# Patient Record
Sex: Male | Born: 1950 | Race: White | Hispanic: No | Marital: Married | Smoking: Former smoker
Health system: Southern US, Community
[De-identification: ages and names within clinical notes are randomized; demographics above are authoritative.]

## PROBLEM LIST (undated history)

## (undated) DIAGNOSIS — M722 Plantar fascial fibromatosis: Secondary | ICD-10-CM

## (undated) DIAGNOSIS — N529 Male erectile dysfunction, unspecified: Secondary | ICD-10-CM

## (undated) DIAGNOSIS — I10 Essential (primary) hypertension: Secondary | ICD-10-CM

## (undated) DIAGNOSIS — E785 Hyperlipidemia, unspecified: Secondary | ICD-10-CM

## (undated) DIAGNOSIS — K51411 Inflammatory polyps of colon with rectal bleeding: Secondary | ICD-10-CM

## (undated) DIAGNOSIS — K219 Gastro-esophageal reflux disease without esophagitis: Secondary | ICD-10-CM

## (undated) HISTORY — DX: Inflammatory polyps of colon with rectal bleeding: K51.411

## (undated) HISTORY — DX: Male erectile dysfunction, unspecified: N52.9

## (undated) HISTORY — PX: KNEE ARTHROSCOPY: SUR90

## (undated) HISTORY — DX: Gastro-esophageal reflux disease without esophagitis: K21.9

## (undated) HISTORY — DX: Essential (primary) hypertension: I10

## (undated) HISTORY — DX: Plantar fascial fibromatosis: M72.2

## (undated) HISTORY — DX: Hyperlipidemia, unspecified: E78.5

---

## 1997-11-29 HISTORY — PX: CHOLECYSTECTOMY: SHX55

## 2003-06-27 ENCOUNTER — Encounter: Payer: Self-pay | Admitting: Family Medicine

## 2003-07-15 ENCOUNTER — Encounter: Payer: Self-pay | Admitting: Family Medicine

## 2005-02-15 ENCOUNTER — Encounter: Admission: RE | Admit: 2005-02-15 | Discharge: 2005-02-15 | Payer: Self-pay | Admitting: Occupational Medicine

## 2005-05-20 ENCOUNTER — Ambulatory Visit (HOSPITAL_BASED_OUTPATIENT_CLINIC_OR_DEPARTMENT_OTHER): Admission: RE | Admit: 2005-05-20 | Discharge: 2005-05-20 | Payer: Self-pay | Admitting: Orthopedic Surgery

## 2005-05-20 ENCOUNTER — Ambulatory Visit (HOSPITAL_COMMUNITY): Admission: RE | Admit: 2005-05-20 | Discharge: 2005-05-20 | Payer: Self-pay | Admitting: Orthopedic Surgery

## 2005-10-16 ENCOUNTER — Ambulatory Visit (HOSPITAL_COMMUNITY): Admission: RE | Admit: 2005-10-16 | Discharge: 2005-10-16 | Payer: Self-pay | Admitting: Sports Medicine

## 2006-05-27 ENCOUNTER — Ambulatory Visit: Payer: Self-pay | Admitting: Family Medicine

## 2006-05-27 LAB — CONVERTED CEMR LAB
Blood Glucose, Fasting: 104 mg/dL
PSA: 1.17 ng/mL
TSH: 1.08 microintl units/mL

## 2006-06-29 ENCOUNTER — Encounter: Payer: Self-pay | Admitting: Family Medicine

## 2007-06-23 ENCOUNTER — Telehealth: Payer: Self-pay | Admitting: Family Medicine

## 2008-02-19 ENCOUNTER — Ambulatory Visit: Payer: Self-pay | Admitting: Family Medicine

## 2008-02-19 LAB — CONVERTED CEMR LAB
ALT: 30 units/L (ref 0–53)
AST: 22 units/L (ref 0–37)
Albumin: 3.7 g/dL (ref 3.5–5.2)
Alkaline Phosphatase: 79 units/L (ref 39–117)
BUN: 17 mg/dL (ref 6–23)
Bilirubin, Direct: 0.1 mg/dL (ref 0.0–0.3)
CO2: 30 meq/L (ref 19–32)
Calcium: 8.8 mg/dL (ref 8.4–10.5)
Chloride: 108 meq/L (ref 96–112)
Cholesterol: 159 mg/dL (ref 0–200)
Creatinine, Ser: 0.9 mg/dL (ref 0.4–1.5)
GFR calc Af Amer: 113 mL/min
GFR calc non Af Amer: 93 mL/min
Glucose, Bld: 96 mg/dL (ref 70–99)
HDL: 32.2 mg/dL — ABNORMAL LOW (ref >39.0)
LDL Cholesterol: 99 mg/dL (ref 0–99)
Potassium: 3.6 meq/L (ref 3.5–5.1)
Sodium: 144 meq/L (ref 135–145)
TSH: 1.24 microintl units/mL (ref 0.35–5.50)
Total Bilirubin: 0.9 mg/dL (ref 0.3–1.2)
Total CHOL/HDL Ratio: 4.9
Total Protein: 6.3 g/dL (ref 6.0–8.3)
Triglycerides: 137 mg/dL (ref 0–149)
VLDL: 27 mg/dL (ref 0–40)

## 2008-02-22 ENCOUNTER — Encounter: Payer: Self-pay | Admitting: Family Medicine

## 2008-02-22 DIAGNOSIS — E7800 Pure hypercholesterolemia, unspecified: Secondary | ICD-10-CM

## 2008-02-22 DIAGNOSIS — J309 Allergic rhinitis, unspecified: Secondary | ICD-10-CM | POA: Insufficient documentation

## 2008-02-22 DIAGNOSIS — Z125 Encounter for screening for malignant neoplasm of prostate: Secondary | ICD-10-CM | POA: Insufficient documentation

## 2008-02-26 ENCOUNTER — Ambulatory Visit: Payer: Self-pay | Admitting: Family Medicine

## 2008-03-20 ENCOUNTER — Emergency Department (HOSPITAL_COMMUNITY): Admission: EM | Admit: 2008-03-20 | Discharge: 2008-03-20 | Payer: Self-pay | Admitting: Emergency Medicine

## 2008-03-28 ENCOUNTER — Encounter (INDEPENDENT_AMBULATORY_CARE_PROVIDER_SITE_OTHER): Payer: Self-pay | Admitting: *Deleted

## 2008-03-28 ENCOUNTER — Ambulatory Visit: Payer: Self-pay | Admitting: Family Medicine

## 2008-03-28 LAB — CONVERTED CEMR LAB
OCCULT 1: NEGATIVE
OCCULT 2: NEGATIVE
OCCULT 3: NEGATIVE

## 2008-09-03 ENCOUNTER — Telehealth: Payer: Self-pay | Admitting: Family Medicine

## 2008-12-02 ENCOUNTER — Ambulatory Visit: Payer: Self-pay | Admitting: Family Medicine

## 2009-02-25 ENCOUNTER — Telehealth: Payer: Self-pay | Admitting: Family Medicine

## 2009-02-26 ENCOUNTER — Encounter: Payer: Self-pay | Admitting: Family Medicine

## 2009-03-06 ENCOUNTER — Ambulatory Visit: Payer: Self-pay | Admitting: Family Medicine

## 2009-03-06 DIAGNOSIS — D126 Benign neoplasm of colon, unspecified: Secondary | ICD-10-CM

## 2009-04-03 ENCOUNTER — Ambulatory Visit: Payer: Self-pay | Admitting: Gastroenterology

## 2009-04-14 ENCOUNTER — Ambulatory Visit: Payer: Self-pay | Admitting: Gastroenterology

## 2009-04-14 ENCOUNTER — Encounter: Payer: Self-pay | Admitting: Gastroenterology

## 2009-04-14 LAB — HM COLONOSCOPY

## 2009-04-15 ENCOUNTER — Encounter: Payer: Self-pay | Admitting: Gastroenterology

## 2009-10-27 ENCOUNTER — Telehealth: Payer: Self-pay | Admitting: Family Medicine

## 2010-03-19 ENCOUNTER — Telehealth: Payer: Self-pay | Admitting: Family Medicine

## 2010-03-30 ENCOUNTER — Encounter: Payer: Self-pay | Admitting: Family Medicine

## 2010-04-06 ENCOUNTER — Ambulatory Visit: Payer: Self-pay | Admitting: Family Medicine

## 2010-04-06 DIAGNOSIS — R739 Hyperglycemia, unspecified: Secondary | ICD-10-CM

## 2010-04-06 DIAGNOSIS — R5383 Other fatigue: Secondary | ICD-10-CM

## 2010-04-06 DIAGNOSIS — R5381 Other malaise: Secondary | ICD-10-CM

## 2010-05-11 ENCOUNTER — Ambulatory Visit: Payer: Self-pay | Admitting: Family Medicine

## 2010-05-11 DIAGNOSIS — R030 Elevated blood-pressure reading, without diagnosis of hypertension: Secondary | ICD-10-CM

## 2010-05-11 DIAGNOSIS — R42 Dizziness and giddiness: Secondary | ICD-10-CM

## 2010-07-07 ENCOUNTER — Encounter (INDEPENDENT_AMBULATORY_CARE_PROVIDER_SITE_OTHER): Payer: Self-pay | Admitting: *Deleted

## 2010-12-29 NOTE — Assessment & Plan Note (Signed)
Summary: CPX/DLO   Vital Signs:  Patient profile:   60 year old male Height:      73 inches Weight:      234.50 pounds BMI:     31.05 Temp:     97.9 degrees F oral Pulse rate:   80 / minute Pulse rhythm:   regular BP sitting:   118 / 70  (left arm) Cuff size:   large  Vitals Entered By: Sydell Axon LPN (Apr 06, 1609 1:53 PM) CC: 30 Minute checkup, had a colonoscopy 05/10 by Dr. Christella Hartigan   History of Present Illness: Pt here for Comp Exam. He has the typical aches and pains of wearing down, slowing down and tired.   Preventive Screening-Counseling & Management  Alcohol-Tobacco     Alcohol drinks/day: 0     Alcohol type:  less than once a month     Smoking Status: quit     Year Quit: 2004     Pack years: 25     Passive Smoke Exposure: no  Caffeine-Diet-Exercise     Caffeine use/day: 0     Does Patient Exercise: no     Times/week: <3  Problems Prior to Update: 1)  Colonic Polyps  (ICD-211.3) 2)  Special Screening Malig Neoplasms Other Sites  (ICD-V76.49) 3)  Health Maintenance Exam  (ICD-V70.0) 4)  Allergic Rhinitis  (ICD-477.9) 5)  Hyperplasia Prostate Uns w/o Ur Obst & Oth Luts  (ICD-600.90) 6)  Pure Hypercholesterolemia  (ICD-272.0)  Medications Prior to Update: 1)  Fexofenadine Hcl 180 Mg  Tabs (Fexofenadine Hcl) .Marland Kitchen.. 1 By Mouth Daily 2)  Nasacort Aq 55 Mcg/act  Aers (Triamcinolone Acetonide(Nasal)) .Marland Kitchen.. 1 Squirt Each Nostril Qd 3)  Glucosamine-Chondroitin 500-400 Mg  Caps (Glucosamine-Chondroitin) .Marland Kitchen.. 1 Daily By Mouth 4)  Ibutab 200 Mg  Tabs (Ibuprofen) .... 2 Tablets As Needed  By Mouth 5)  Fish Oil Concentrate 1000 Mg Caps (Omega-3 Fatty Acids) .... 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 (DR JACOBS) 04/14/2009  Family History: Last updated: 04/06/2010 Father:dec HOMICIDE VICTIM  +ETOH- Mother: dec  70 YOA : Emrg Surgery, bleeding internally, unsuccessful.+ELEVATED BP; +STROKES MULTIPLE BROTHER A 55 BROTHER A 49 SISTER A 61 CV: NEGATIVE HBP: + MOTHER DM: NEGATIVE GOUT/ARTHRITIS: PROSTATE CANCER: NEGATIVE BREAST/OVARIAN CANCER: NEGATIVE UTERINE CANCER: + MGM COLON CANCER: NEGATIVE DEPRESSION: NEGATIVE ETOH/DRUG ABUSE: +ETOH FATHER OTHER: + STROKES MOTHER MULTIPLE  Social History: Last updated: 02/22/2008 Marital Status: Married  LIVES WITH WIFE Children: 2 CHILDREN 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  Risk Factors: Alcohol Use: 0 (04/06/2010) Caffeine Use: 0 (04/06/2010) Exercise: no (04/06/2010)  Risk Factors: Smoking Status: quit (04/06/2010) Passive Smoke Exposure: no (04/06/2010)  Family History: Father:dec HOMICIDE VICTIM  +ETOH- Mother: dec  75 YOA : Emrg Surgery, bleeding internally, unsuccessful.+ELEVATED BP; +STROKES MULTIPLE BROTHER A 55 BROTHER A 49 SISTER A 61 CV: NEGATIVE HBP: + MOTHER DM: NEGATIVE GOUT/ARTHRITIS: PROSTATE CANCER: NEGATIVE BREAST/OVARIAN CANCER: NEGATIVE UTERINE CANCER: + MGM COLON CANCER: NEGATIVE DEPRESSION: NEGATIVE ETOH/DRUG ABUSE: +ETOH FATHER OTHER: + STROKES MOTHER MULTIPLE  Social History: Does Patient Exercise:  no  Review of Systems General:  Complains of fatigue; denies chills, fever, sweats, weakness, and weight  loss; see HPI. Eyes:  Denies blurring, discharge, and eye pain. ENT:  Denies decreased hearing, earache, and ringing in ears. CV:  Complains of fatigue; denies chest pain or discomfort, fainting, palpitations, shortness of breath with exertion, swelling of feet, and swelling of hands. Resp:  Denies cough, shortness of breath, and wheezing. GI:   Denies abdominal pain, bloody stools, change in bowel habits, constipation, dark tarry stools, diarrhea, indigestion, loss of appetite, nausea, vomiting, vomiting blood, and yellowish skin color. GU:  Denies discharge, dysuria, nocturia, and urinary frequency. MS:  Complains of low back pain; denies joint pain, muscle aches, cramps, and stiffness. Derm:  Denies dryness, itching, and rash. Neuro:  Denies numbness, poor balance, tingling, and tremors.  Physical Exam  General:  Well-developed,well-nourished,in no acute distress; alert,appropriate and cooperative throughout examination, slightly more  overweight. Head:  Normocephalic and atraumatic without obvious abnormalities. No apparent alopecia or balding. Sinuses nontender. Eyes:  Conjunctiva clear bilaterally.  Ears:  External ear exam shows no significant lesions or deformities.  Otoscopic examination reveals clear canals, tympanic membranes are intact bilaterally without bulging, retraction, inflammation or discharge. Hearing is grossly normal bilaterally. Nose:  External nasal examination shows no deformity or inflammation. Nasal mucosa are pink and moist without lesions or exudates. Mouth:  Oral mucosa and oropharynx without lesions or exudates.  Teeth in good repair. Neck:  No deformities, masses, or tenderness noted. Chest Wall:  No deformities, masses, tenderness or gynecomastia noted. Breasts:  No masses or gynecomastia noted Lungs:  Normal respiratory effort, chest expands symmetrically. Lungs are clear to auscultation, no crackles or wheezes. Heart:  Normal rate and regular rhythm. S1 and S2 normal without gallop, murmur, click, rub or other extra sounds. Abdomen:  Bowel sounds positive,abdomen soft and non-tender without masses, organomegaly or hernias noted. Rectal:  No external abnormalities noted. Normal sphincter tone. No rectal masses or tenderness. G neg. Genitalia:  Testes bilaterally descended without nodularity,  tenderness or masses. No scrotal masses or lesions. No penis lesions or urethral discharge. Prostate:  Prostate gland firm and smooth, no enlargement, nodularity, tenderness. 20gms. Msk:  No deformity or scoliosis noted of thoracic or lumbar spine.   Pulses:  R and L carotid,radial,femoral,dorsalis pedis and posterior tibial pulses are full and equal bilaterally Extremities:  No clubbing, cyanosis, edema, or deformity noted with normal full range of motion of all joints.   Neurologic:  No cranial nerve deficits noted. Station and gait are normal. Sensory, motor and coordinative functions appear intact. Skin:  Intact without suspicious lesions or rashes Cervical Nodes:  No lymphadenopathy noted Inguinal Nodes:  No significant adenopathy Psych:  Cognition and judgment appear intact. Alert and cooperative with normal attention span and concentration. No apparent delusions, illusions, hallucinations   Impression & Recommendations:  Problem # 1:  HEALTH MAINTENANCE EXAM (ICD-V70.0)  Reviewed preventive care protocols, scheduled due services, and updated immunizations. T dap updated, given at [facility].  Problem # 2:  COLONIC POLYPS (ICD-211.3) Assessment: Unchanged Found polyp again. Followup per GI.  Problem # 3:  ALLERGIC RHINITIS (ICD-477.9) Assessment: Unchanged  Stable. His updated medication list for this problem includes:    Fexofenadine Hcl 180 Mg Tabs (Fexofenadine hcl) .Marland Kitchen... 1 by mouth daily    Nasacort Aq 55 Mcg/act Aers (Triamcinolone acetonide(nasal)) .Marland Kitchen... 1 squirt each nostril daily  Problem # 4:  HYPERPLASIA PROSTATE UNS W/O UR OBST & OTH LUTS (ICD-600.90) Assessment: Unchanged Stable sxs. PSA and exam nml.  Problem # 5:  PURE HYPERCHOLESTEROLEMIA (ICD-272.0) Assessment: Unchanged Stable avoid  sweets and carbs. Labs Reviewed: SGOT: 22 (02/19/2008)   SGPT: 30 (02/19/2008)   HDL:32.2 (02/19/2008)  LDL:99 (02/19/2008)  Chol:159 (02/19/2008)  Trig:137 (02/19/2008)  Problem #  6:  FASTING HYPERGLYCEMIA (ICD-790.29) Assessment: New  Discussed avoiding sweets and carbs.  Labs Reviewed: Creat: 0.9 (02/19/2008)     Problem # 7:  FATIGUE (ICD-780.79) Assessment: New CBC and TSH ok. Will check Vit D. Pt vocalizes mild job dissatisfaction and mild depression therefrom which comes and goes. If Vit D nml, activity will help some. Come in if sxs worsen. Pt agreed.  Complete Medication List: 1)  Fexofenadine Hcl 180 Mg Tabs (Fexofenadine hcl) .Marland Kitchen.. 1 by mouth daily 2)  Nasacort Aq 55 Mcg/act Aers (Triamcinolone acetonide(nasal)) .Marland Kitchen.. 1 squirt each nostril daily 3)  Glucosamine-chondroitin 500-400 Mg Caps (Glucosamine-chondroitin) .Marland Kitchen.. 1 daily by mouth 4)  Ibutab 200 Mg Tabs (Ibuprofen) .... 2 tablets as needed  by mouth 5)  Fish Oil Concentrate 1000 Mg Caps (Omega-3 fatty acids) .... Twice a day by mouth 6)  Multivitamins Tabs (Multiple vitamin) .... Take one by mouth daily  Patient Instructions: 1)  Will check Vit D.lvl due to being tired.  2)  Call phone tree [phone], then [phone].  Current Allergies (reviewed today): No known allergies

## 2010-12-29 NOTE — Assessment & Plan Note (Signed)
Summary: EPISODE OF VERTIGO AND B/P SPIKED UP   Vital Signs:  Patient profile:   60 year old male Weight:      238 pounds Temp:     98.4 degrees F oral Pulse rate:   76 / minute Pulse rhythm:   regular BP sitting:   124 / 80  (left arm) Cuff size:   large  Vitals Entered By: Sydell Axon LPN (May 11, 2010 3:33 PM) CC: Had a bad episode of vertigo and BP has been elevated   History of Present Illness: Pt here for followup of vertigo, seen at [facility] one week ago. He woke up one day with eyes jerking thinking he was having a real problem and had bad spinning and resultant N/V. He figured out he had vertigo and pressure was 168/95. We don't know whether he had elevated BP or vertigo first. He was given  meclizine and phenergan, both,  which kept him subdued for the next few days!!!  His BP has been in the 160-170 range until Noon today and then was back down to 140/80s. He feels reasonably well otherwise.   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 1 By Mouth Daily 2)  Nasacort Aq 55 Mcg/act  Aers (Triamcinolone Acetonide(Nasal)) .Marland Kitchen.. 1 Squirt Each Nostril Daily 3)  Glucosamine-Chondroitin 500-400 Mg  Caps (Glucosamine-Chondroitin) .Marland Kitchen.. 1 Daily By Mouth 4)  Ibutab 200 Mg  Tabs (Ibuprofen) .... 2 Tablets As Needed  By Mouth 5)  Fish Oil Concentrate 1000 Mg Caps (Omega-3 Fatty Acids) .... Twice A Day By Mouth 6)  Multivitamins  Tabs (Multiple Vitamin) .... Take One By Mouth Daily  Allergies: No Known Drug Allergies  Physical Exam  General:   Well-developed,well-nourished,in no acute distress; alert,appropriate and cooperative throughout examination. Head:  Normocephalic and atraumatic without obvious abnormalities. No apparent alopecia or balding. Sinuses nontender. Eyes:  Conjunctiva clear bilaterally. No Nystagmus seen or inducible. Ears:  External ear exam shows no significant lesions or deformities.  Otoscopic examination reveals clear canals, tympanic membranes are intact bilaterally without bulging, retraction, inflammation or discharge. Hearing is grossly normal bilaterally. Nose:  External nasal examination shows no deformity or inflammation. Nasal mucosa are pink and moist without lesions or exudates. Mouth:  Oral mucosa and oropharynx without lesions or exudates.  Teeth in good repair. Neck:  No deformities, masses, or tenderness noted. Chest Wall:  No deformities, masses, tenderness or gynecomastia noted. Lungs:  Normal respiratory effort, chest expands symmetrically. Lungs are clear to auscultation, no crackles or wheezes. Heart:  Normal rate and regular rhythm. S1 and S2 normal without gallop, murmur, click, rub or other extra sounds. Neurologic:  No cranial nerve deficits noted. Station and gait are normal. Sensory, motor and coordinative functions appear intact.   Impression & Recommendations:  Problem # 1:  VERTIGO (ICD-780.4) Assessment New Discussed pathophysiology. Has as needed meds. Hopefully won't need any more. His updated medication list for this problem includes:    Fexofenadine Hcl 180 Mg Tabs (Fexofenadine hcl) .Marland Kitchen... 1 by mouth daily  Problem # 2:  ELEVATED BLOOD PRESSURE WITHOUT  DIAGNOSIS OF HYPERTENSION (ICD-796.2) Assessment: New Monitor BP for a while and assess trend. Come in if trend elevated. BP today: 124/80 Prior BP: 118/70 (04/06/2010)  Labs Reviewed: Creat: 0.9 (02/19/2008) Chol: 159 (02/19/2008)   HDL: 32.2 (02/19/2008)   LDL: 99 (02/19/2008)   TG: 137 (02/19/2008)  Instructed in low  sodium diet (DASH Handout) and behavior modification.    Complete Medication List: 1)  Fexofenadine Hcl 180 Mg Tabs (Fexofenadine hcl) .Marland Kitchen.. 1 by mouth daily 2)  Nasacort Aq 55 Mcg/act Aers (Triamcinolone acetonide(nasal)) .Marland Kitchen.. 1 squirt each nostril daily 3)  Glucosamine-chondroitin 500-400 Mg Caps (Glucosamine-chondroitin) .Marland Kitchen.. 1 daily by mouth 4)  Ibutab 200 Mg Tabs (Ibuprofen) .... 2 tablets as needed  by mouth 5)  Fish Oil Concentrate 1000 Mg Caps (Omega-3 fatty acids) .... Twice a day by mouth 6)  Multivitamins Tabs (Multiple vitamin) .... Take one by mouth daily  Patient Instructions: 1)  spent with pt.  Current Allergies (reviewed today): No known allergies

## 2010-12-29 NOTE — Progress Notes (Signed)
Summary: needs order for [facility]  Phone Note Call from Patient   Caller: Patient Call For: Shaune Leeks MD Summary of Call: Pt is coming for lab work next month and needs an order mailed to him that he can take to [facility]. Initial call taken by: Lowella Petties CMA,  March 19, 2010 3:16 PM  Follow-up for Phone Call        done. Follow-up by: Shaune Leeks MD,  March 19, 2010 4:52 PM  Additional Follow-up for Phone Call Additional follow up Details #1::        Order mailed. Additional Follow-up by: Lowella Petties CMA,  March 19, 2010 5:18 PM

## 2010-12-29 NOTE — Letter (Signed)
Summary: Nadara Eaton letter  [facility] at [facility]  [address] [place], [place] [address]   Phone: [phone]  Fax: [phone]       07/07/2010 MRN: [id number]  LOCHLIN EPPINGER [address] [place], [place]  [address]  Dear Mr. [facility], and [facility] announce the retirement of Arta Silence, M.D., from full-time practice at the [facility] office effective May 28, 2010 and his plans of returning part-time.  It is important to Dr. Hetty Ely and to our practice that you understand that [facility] has seven physicians in our office for your health care needs.  We will continue to offer the same exceptional care that you have today.    Dr. Hetty Ely has spoken to many of you about his plans for retirement and returning part-time in the fall.   We will continue to work with you through the transition to schedule appointments for you in the office and meet the high standards that [facility] is committed to.   Again, it is with great pleasure that we share the news that Dr. Hetty Ely will return to [facility] at [facility] in October of 2011 with a reduced schedule.    If you have any questions, or would like to request an appointment with one of our physicians, please call us at [phone] and press the option for Scheduling an appointment.  We take pleasure in providing you with excellent patient care and look forward to seeing you at your next office visit.  Our [facility] Physicians are:  Tillman Abide, M.D. Laurita Quint, M.D. Roxy Manns, M.D. Kerby Nora, M.D. Hannah Beat, M.D. Ruthe Mannan, M.D. We proudly welcomed Raechel Ache, M.D. and Eustaquio Boyden, M.D. to the practice in July/August 2011.  Sincerely,  [facility]

## 2011-04-16 NOTE — Op Note (Signed)
[name], [name]               ACCOUNT NO.:  [id number]   MEDICAL RECORD NO.:  [id number]          PATIENT TYPE:  AMB   LOCATION:  [facility]                          FACILITY:  [facility]   PHYSICIAN:  Rodney A. Mortenson, M.D.DATE OF BIRTH:  08-05-1951   DATE OF PROCEDURE:  05/20/2005  DATE OF DISCHARGE:                                 OPERATIVE REPORT   PREOPERATIVE DIAGNOSES:  Tear of medial and lateral meniscus, left knee;  status post reconstruction of the anterior cruciate ligament, left knee;  early osteoarthritis, left knee.   POSTOPERATIVE DIAGNOSES:  Chondromalacia of the left patella; tear posterior  horn medial meniscus; several bony loose bodies, left knee; extensive  suprapatellar adhesions; early osteoarthritis, left knee.   PROCEDURE:  Chondroplasty of the patella; partial medial meniscectomy;  removal of several bony loose bodies from intercondylar notch area;  arthroscopic lysis of the suprapatellar adhesions.   SURGEON:  Dr. Chaney Malling.   ANESTHESIA:  MAC.   PATHOLOGY:  We arthroscoped the knee very carefully and examination of the  knee was undertaken.  The patellofemoral joint was visualized first and  there was extensive chondromalacia over the lateral patellar facet.  The  femoral notch area appeared fairly normal.  With the arthroscope in the  lateral compartment, the articular cartilage of the lateral femoral condyle,  lateral tibial plateau and the entire circumference of the lateral meniscus  was normal.  With the arthroscope into the intercondylar notch area,  initially the anterior cruciate ligament looked fairly normal but on further  examination and palpation, it was fairly insufficient.  The lateral notch  plasty was insufficient and a row of osteophytes had developed in this area.  There was some bony loose bodies stuck in scar tissue, again in the notch  area.  In the medial compartment, there was a small divot in the medial  femoral condyle and there  was an extensive tear in the posterior horn of the  medial meniscus from the posterior third to the junction of the mid and  posterior meniscus.  In the suprapatellar pouch, there were extensive, heavy  dense adhesions in the lateral recesses there and medial recesses.  There  were adhesions about the knee.   DESCRIPTION OF PROCEDURE:  The patient was placed on the operating table in  the supine position.  The pneumatic tourniquet was placed about the left  upper thigh.  The left leg is placed in a leg holder and the entire left  lower extremity was prepped with DuraPrep and draped out in the usual  manner.  Marcaine was placed and then Xylocaine and epinephrine used to  infiltrate the puncture wounds.  An infusion cannula was placed in the  superomedial pouch and the knee distended with saline.  Anteromedial,  anterolateral and a midline patellar portal were made.  Attention was first  turned to the lateral patellar facet.  This was debrided with a  chondroplasty shaver and this was debrided very nicely.  The arthroscope was  then placed in the lateral compartment and again, there was no significant  abnormality in this area.  In  the femoral notch area, there was a row of  osteophytes where the lateral notch plasty was done and this was fairly  insufficient.  There were pieces of bone stuck in the scar tissue in the  notch area and these were loose and were removed with a series of  pituitaries and teasing these loose pieces of bone out.  The arthroscope was  then placed in the medial compartment and there was extensive tearing of the  posterior one-half of the medial meniscus and this was debrided through all  anterior portals with a series of baskets followed by the intra-articular  shaver.  Excellent decompression of the torn meniscus was achieved.  The  arthroscope was then placed in the superior pouch where there were heavy  dense adhesions which blocked full flexion of the knee.  A  partial  synovectomy was done and all adhesions were disrupted with the arthroscope.  Both medial and lateral recesses were explored and debrided with the  arthroscope.  Preoperatively, the knee flexed about 110 degrees.  After  surgery, after release of all the adhesions, the knee would flex fully to  about 130-140 degrees.  Marcaine was again placed in the knee and a large  bulky compressive dressing was applied and the patient returned to the  recovery room in excellent condition.  Technically, the procedure went  extremely well.   DRAINS:  None.   COMPLICATIONS:  None.   FOLLOW-UP CARE:  1.  Percocet for pain.  2.  To my office on Monday.  3.  Start intensive physical therapy.       RAM/MEDQ  D:  05/20/2005  T:  05/20/2005  Job:  [id number]

## 2011-04-20 ENCOUNTER — Encounter: Payer: Self-pay | Admitting: Family Medicine

## 2011-04-23 ENCOUNTER — Telehealth: Payer: Self-pay | Admitting: *Deleted

## 2011-04-23 NOTE — Telephone Encounter (Signed)
Attempted to call patient at work to notify him of Rx. He had already left for the day. There was no answer or machine at the home #. Unable to leave message. Will try again later.

## 2011-04-23 NOTE — Telephone Encounter (Signed)
Pt is coming in for a physical in a couple of weeks with Dr. Hetty Ely and is asking for an order to get labs prior, to take to [facility], where his wife works.  He wants to go on Tuesday, 5/29, so he will need the order today.  I dont know how to do this with Dr. Hetty Ely not being here. Please advise.

## 2011-04-23 NOTE — Telephone Encounter (Signed)
Labs ordered on rx slip for patient.  Cmet/lipid 272.0 and PSA 600.90. Please have pt pick up and direct the results to Dr. Hetty Ely.

## 2011-04-27 NOTE — Telephone Encounter (Signed)
Call placed to patient at [phone], no answer, no voice mail.

## 2011-04-30 ENCOUNTER — Encounter: Payer: Self-pay | Admitting: Family Medicine

## 2011-04-30 NOTE — Telephone Encounter (Signed)
Patient came and picked up Rx for labs on 04-27-11.

## 2011-05-05 ENCOUNTER — Ambulatory Visit (INDEPENDENT_AMBULATORY_CARE_PROVIDER_SITE_OTHER): Payer: 59 | Admitting: Family Medicine

## 2011-05-05 ENCOUNTER — Encounter: Payer: Self-pay | Admitting: Family Medicine

## 2011-05-05 DIAGNOSIS — R7309 Other abnormal glucose: Secondary | ICD-10-CM

## 2011-05-05 DIAGNOSIS — R5381 Other malaise: Secondary | ICD-10-CM

## 2011-05-05 DIAGNOSIS — N400 Enlarged prostate without lower urinary tract symptoms: Secondary | ICD-10-CM

## 2011-05-05 DIAGNOSIS — D126 Benign neoplasm of colon, unspecified: Secondary | ICD-10-CM

## 2011-05-05 DIAGNOSIS — R030 Elevated blood-pressure reading, without diagnosis of hypertension: Secondary | ICD-10-CM

## 2011-05-05 DIAGNOSIS — R42 Dizziness and giddiness: Secondary | ICD-10-CM

## 2011-05-05 DIAGNOSIS — E7800 Pure hypercholesterolemia, unspecified: Secondary | ICD-10-CM

## 2011-05-05 MED ORDER — SILDENAFIL CITRATE 100 MG PO TABS: ORAL_TABLET | ORAL | Status: AC

## 2011-05-05 NOTE — Assessment & Plan Note (Signed)
PSA nml.  Urination ok.

## 2011-05-05 NOTE — Assessment & Plan Note (Signed)
Adequate. Discussed.

## 2011-05-05 NOTE — Assessment & Plan Note (Signed)
Adequate except for HDL. Discussed.

## 2011-05-05 NOTE — Assessment & Plan Note (Signed)
Been monitored long enough to ascertain this is not a problem. Will remove from list.

## 2011-05-05 NOTE — Assessment & Plan Note (Signed)
Currently resolved. Discussed recurrence rate.

## 2011-05-05 NOTE — Progress Notes (Signed)
Subjective:    Patient ID: [name], male    DOB: [birth year], 60 y.o.   MRN: [id number]  HPIPt here for Comp Exam. He was seen last June (6/11) for vertigo. He really has not had any since. He had significant twitching eyes. He had an inversion table in the past and has retired that. His BP has been fine at home. I think that is now a mute issue.  He has right knee pain since last Fall, in the medial collat lig area. He can get in to see Ortho. His feet are bothering him. He has had plantar fasciitis and his orthotics are not helping.  He has some libido difficulties and has ED. He has no problem getting an erection or semierect and is having some difficulties.    Review of Systems  Constitutional: Negative for fever, chills, diaphoresis, appetite change, fatigue and unexpected weight change.  HENT: Negative for hearing loss, ear pain, tinnitus and ear discharge.   Eyes: Negative for pain, discharge and visual disturbance.  Respiratory: Negative for cough, shortness of breath and wheezing.   Cardiovascular: Negative for chest pain and palpitations.       No SOB w/ exertion  Gastrointestinal: Negative for nausea, vomiting, abdominal pain, diarrhea, constipation and blood in stool.       No heartburn or swallowing problems.  Genitourinary: Negative for dysuria, frequency and difficulty urinating.       No nocturia Some hesitancy.  Musculoskeletal: Positive for arthralgias. Negative for myalgias and back pain.  Skin: Negative for rash.       No itching or dryness.  Neurological: Negative for tremors and numbness.       No tingling or balance problems.  Hematological: Negative for adenopathy. Does not bruise/bleed easily.  Psychiatric/Behavioral: Negative for dysphoric mood and agitation.    Patient Active Problem List  Diagnoses  . COLONIC POLYPS  . PURE HYPERCHOLESTEROLEMIA  . ALLERGIC RHINITIS  . HYPERPLASIA PROSTATE UNS W/O UR OBST & OTH LUTS  . VERTIGO  . FATIGUE  .  FASTING HYPERGLYCEMIA  . ELEVATED BLOOD PRESSURE WITHOUT DIAGNOSIS OF HYPERTENSION   Past Medical History  Diagnosis Date  . Allergic rhinitis 04/2006  . Esophagitis   . Inflammatory polyps of colon with rectal bleeding     /colonoscopy 2004, 04/14/09   Past Surgical History  Procedure Date  . Cholecystectomy 1999  . Knee arthroscopy     left, multiple- 5 times, last 2006   History  Substance Use Topics  . Smoking status: Not on file  . Smokeless tobacco: Not on file  . Alcohol Use: Not on file   Family History  Problem Relation Age of Onset  . Hypertension Mother   . Stroke Mother     multiple strokes  . Alcohol abuse Father   . Cancer Maternal Grandmother     uterine  . Diabetes Neg Hx   . Depression Neg Hx    Allergies not on file Current Outpatient Prescriptions on File Prior to Visit  Medication Sig Dispense Refill  . fexofenadine (ALLEGRA) 180 MG tablet Take 180 mg by mouth daily.        Marland Kitchen glucosamine-chondroitin 500-400 MG tablet Take one daily by mouth       . ibuprofen (ADVIL,MOTRIN) 200 MG tablet Take 2 tablets as needed by mouth       . Multiple Vitamin (MULTIVITAMIN) tablet Take 1 tablet by mouth daily.        . Omega-3 Fatty Acids (FISH  OIL) 1000 MG CAPS Take one twice a day by mouth       . triamcinolone (NASACORT AQ) 55 MCG/ACT nasal inhaler 1 spray by Nasal route daily.              Objective:   Physical Exam  Constitutional: He is oriented to person, place, and time. He appears well-developed and well-nourished. No distress.  HENT:  Head: Normocephalic and atraumatic.  Right Ear: External ear normal.  Left Ear: External ear normal.  Nose: Nose normal.  Mouth/Throat: Oropharynx is clear and moist.  Eyes: Conjunctivae and EOM are normal. Pupils are equal, round, and reactive to light. Right eye exhibits no discharge. Left eye exhibits no discharge. No scleral icterus.  Neck: Normal range of motion. Neck supple. No thyromegaly present.    Cardiovascular: Normal rate, regular rhythm, normal heart sounds and intact distal pulses.   No murmur heard. Pulmonary/Chest: Effort normal and breath sounds normal. No respiratory distress. He has no wheezes.  Abdominal: Soft. Bowel sounds are normal. He exhibits no distension and no mass. There is no tenderness. There is no rebound and no guarding.  Genitourinary: Rectum normal, prostate normal and penis normal. Guaiac negative stool.       Prostate 20-30gms, firm and symmetric.  Musculoskeletal: Normal range of motion. He exhibits no edema.  Lymphadenopathy:    He has no cervical adenopathy.  Neurological: He is alert and oriented to person, place, and time. Coordination normal.  Skin: Skin is warm and dry. No rash noted. He is not diaphoretic.  Psychiatric: He has a normal mood and affect. His behavior is normal. Judgment and thought content normal.          Assessment & Plan:

## 2011-05-05 NOTE — Assessment & Plan Note (Signed)
Didn't discuss fatigue today but ED and Decr Libido...will try Viagra and if not terribly effective, refer to Urology.

## 2011-05-05 NOTE — Assessment & Plan Note (Signed)
Relook in 2015.

## 2011-06-29 ENCOUNTER — Other Ambulatory Visit (HOSPITAL_COMMUNITY): Payer: Self-pay | Admitting: Orthopaedic Surgery

## 2011-06-29 DIAGNOSIS — M25561 Pain in right knee: Secondary | ICD-10-CM

## 2011-07-06 ENCOUNTER — Inpatient Hospital Stay (HOSPITAL_COMMUNITY): Admission: RE | Admit: 2011-07-06 | Payer: 59 | Source: Ambulatory Visit

## 2011-07-14 ENCOUNTER — Ambulatory Visit (HOSPITAL_COMMUNITY)
Admission: RE | Admit: 2011-07-14 | Discharge: 2011-07-14 | Disposition: A | Discharge status: Home / Self Care | Payer: 59 | Source: Ambulatory Visit | Attending: Orthopaedic Surgery | Admitting: Orthopaedic Surgery

## 2011-07-14 DIAGNOSIS — X58XXXA Exposure to other specified factors, initial encounter: Secondary | ICD-10-CM | POA: Insufficient documentation

## 2011-07-14 DIAGNOSIS — M25561 Pain in right knee: Secondary | ICD-10-CM

## 2011-07-14 DIAGNOSIS — IMO0002 Reserved for concepts with insufficient information to code with codable children: Secondary | ICD-10-CM | POA: Insufficient documentation

## 2011-08-10 ENCOUNTER — Encounter (HOSPITAL_BASED_OUTPATIENT_CLINIC_OR_DEPARTMENT_OTHER)
Admission: RE | Admit: 2011-08-10 | Discharge: 2011-08-10 | Disposition: A | Discharge status: Home / Self Care | Payer: 59 | Source: Ambulatory Visit | Attending: Orthopaedic Surgery | Admitting: Orthopaedic Surgery

## 2011-08-12 ENCOUNTER — Ambulatory Visit (HOSPITAL_BASED_OUTPATIENT_CLINIC_OR_DEPARTMENT_OTHER)
Admission: RE | Admit: 2011-08-12 | Discharge: 2011-08-12 | Disposition: A | Discharge status: Home / Self Care | Payer: 59 | Source: Ambulatory Visit | Attending: Orthopaedic Surgery | Admitting: Orthopaedic Surgery

## 2011-08-12 DIAGNOSIS — F17200 Nicotine dependence, unspecified, uncomplicated: Secondary | ICD-10-CM | POA: Insufficient documentation

## 2011-08-12 DIAGNOSIS — M23302 Other meniscus derangements, unspecified lateral meniscus, unspecified knee: Secondary | ICD-10-CM | POA: Insufficient documentation

## 2011-08-12 DIAGNOSIS — J4489 Other specified chronic obstructive pulmonary disease: Secondary | ICD-10-CM | POA: Insufficient documentation

## 2011-08-12 DIAGNOSIS — Z01810 Encounter for preprocedural cardiovascular examination: Secondary | ICD-10-CM | POA: Insufficient documentation

## 2011-08-12 DIAGNOSIS — Z01812 Encounter for preprocedural laboratory examination: Secondary | ICD-10-CM | POA: Insufficient documentation

## 2011-08-12 DIAGNOSIS — M23329 Other meniscus derangements, posterior horn of medial meniscus, unspecified knee: Secondary | ICD-10-CM | POA: Insufficient documentation

## 2011-08-12 DIAGNOSIS — M2240 Chondromalacia patellae, unspecified knee: Secondary | ICD-10-CM | POA: Insufficient documentation

## 2011-08-12 DIAGNOSIS — J449 Chronic obstructive pulmonary disease, unspecified: Secondary | ICD-10-CM | POA: Insufficient documentation

## 2011-08-12 LAB — POCT HEMOGLOBIN-HEMACUE: Hemoglobin: 15.8 g/dL (ref 13.0–17.0)

## 2011-08-23 NOTE — Op Note (Signed)
NAMESIMMIE, CAMERER NO.:  [id number]  MEDICAL RECORD NO.:  [id number]  LOCATION:  MRI                          FACILITY:  [facility]  PHYSICIAN:  Claude Manges. Dedee Liss, M.D.DATE OF BIRTH:  1951-05-31  DATE OF PROCEDURE: DATE OF DISCHARGE:  07/14/2011                              OPERATIVE REPORT   PREOPERATIVE DIAGNOSES: 1. Tear, medial meniscus, right knee. 2. Possible tear, lateral meniscus, right knee.  POSTOPERATIVE DIAGNOSIS:  Tear medial and lateral menisci, right knee with mild chondromalacia patella.  PROCEDURE: 1. Diagnostic arthroscopy, right knee. 2. Partial medial and lateral meniscectomy.  SURGEON:  Claude Manges. Cleophas Dunker, MD  ASSISTANT:  Oris Drone. Petrarca, PA-C  ANESTHESIA:  General LMA.  COMPLICATIONS:  None.  HISTORY:  A 60 year old [employer] employee has been experiencing pain in his right knee for approximately 9 months.  He denies any history of injury or trauma.  He was seen in the office in June.  Cortisone injection was performed with only 2 weeks relief of his pain.  He subsequently had an MRI scan with a concern for tear of the medial meniscus.  The scan revealed an oblique tear of the posterior horn of the medial meniscus without a displaced fragment.  There was a possible tiny radial tear on the central aspect of the body of the lateral meniscus.  He is now scheduled to have an arthroscopic evaluation.  PROCEDURE:  Mr. Heidrick was met with his wife in the holding area.  I marked his right knee as the appropriate operative site.  The patient was then transported to room #6 and placed under general anesthesia without difficulty.  The right lower extremity was placed in a thigh holder and the leg prepped with DuraPrep and a thigh holder of the ankle.  Sterile draping was performed.  I injected the subcutaneous tissue with 0.5% Marcaine with epinephrine. I then made 2 small arthroscopic portals with no bleeding.  The arthroscope  was placed in the lateral portal and into the joint.  There was no effusion.  Diagnostic arthroscopy revealed minimal synovitis in the superior pouch. There were some areas of chondromalacia grade II of the lateral patellar facet.  I did not see a patellar tilt.  There were no loose bodies.  I did not see any chondromalacia of the corresponding trochlea.  There was a thickened plica along the medial pair gutter.  This was minimally debrided only in the areas where there was synovitis.  Both gutters were clear of loose body.  The medial compartments were then evaluated.  There was a complex tear of the posterior horn.  There was some underneath surface tearing and small radial tears.  These were debrided with the basket forceps, Cuda shaver and then sealed with the ArthroCare wand and I carefully probed the remaining meniscus and it was intact.  I tapered it at the junction of the middle and posterior thirds.  There was some just grade 1 changes of chondromalacia.  The ACL appeared to be thin but functional.  PCL was intact.  The lateral meniscus revealed tear of the very posterior root.  There was fray.  There was a small radial tear.  This  was debrided with a Cuda shaver and then stabilized with the ArthroCare wand.  There was 1 small radial tear at the junction of the middle and posterior thirds which I also debrided.  Any bleeding was controlled with the ArthroCare wand.  The arthroscopic fluid was removed.  I infiltrated the 2 arthroscopic portals with 0.25% Marcaine with epinephrine.  Sterile bulky dressing was applied followed by an Ace bandage.  Plain oxycodone for pain, crutches off 1 week.     Claude Manges. Cleophas Dunker, M.D.     PWW/MEDQ  D:  08/12/2011  T:  08/12/2011  Job:  [id number]  Electronically Signed by Norlene Campbell M.D. on 08/23/2011 12:33:55 PM

## 2011-11-08 ENCOUNTER — Ambulatory Visit (INDEPENDENT_AMBULATORY_CARE_PROVIDER_SITE_OTHER): Payer: 59 | Admitting: Family Medicine

## 2011-11-08 ENCOUNTER — Encounter: Payer: Self-pay | Admitting: Family Medicine

## 2011-11-08 DIAGNOSIS — H6690 Otitis media, unspecified, unspecified ear: Secondary | ICD-10-CM | POA: Insufficient documentation

## 2011-11-08 MED ORDER — AMOXICILLIN 875 MG PO TABS: 875.0000 mg | ORAL_TABLET | Freq: Two times a day (BID) | ORAL | Status: AC

## 2011-11-08 NOTE — Patient Instructions (Signed)
Drink plenty of fluids, take tylenol/ibuprofen as needed with food, and gargle with warm salt water for your throat.  Use the nasal spray and take the amoxil for 10 days. This should gradually improve.  Take care.  Let us know if you have other concerns.

## 2011-11-08 NOTE — Assessment & Plan Note (Signed)
Continue routine allergy meds and antipyretics.  Start amoxil given the L TM findings.  Okay for outpatient f/u.  He has benign exam o/w.  Work note given, today was the first day out of work.  Potentially contagious.  F/u prn.

## 2011-11-08 NOTE — Progress Notes (Signed)
Started Saturday with aches.  Since then with sweats.  Took some mucinex for facial congestion.  Taking APAP and ibuprofen.  Fever, vomiting (Sunday, possibly from drainage), and diarrhea (this AM).  ST noted.  Fever broke this AM.  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  Prev with L TM drainage and L ear pain.  Mult sick contacts.  Meds, vitals, and allergies reviewed.   ROS: See HPI.  Otherwise, noncontributory.  GEN: nad, alert and oriented HEENT: mucous membranes moist, R tm w/o erythema,L TM red and bulging, canals wnl, nasal exam w/o erythema, clear discharge noted,  OP with cobblestoning but no exudates, frontal and max sinus not ttp B NECK: supple w/o LA CV: rrr.   PULM: ctab, no inc wob EXT: no edema SKIN: no acute rash

## 2011-11-25 ENCOUNTER — Other Ambulatory Visit: Payer: Self-pay | Admitting: Family Medicine

## 2011-11-25 NOTE — Telephone Encounter (Signed)
Received refill request electronically from pharmacy. Is it okay to refill medication? 

## 2012-01-05 ENCOUNTER — Ambulatory Visit (INDEPENDENT_AMBULATORY_CARE_PROVIDER_SITE_OTHER): Payer: 59 | Admitting: Family Medicine

## 2012-01-05 ENCOUNTER — Ambulatory Visit (INDEPENDENT_AMBULATORY_CARE_PROVIDER_SITE_OTHER)
Admission: RE | Admit: 2012-01-05 | Discharge: 2012-01-05 | Disposition: A | Discharge status: Home / Self Care | Payer: 59 | Source: Ambulatory Visit | Attending: Family Medicine | Admitting: Family Medicine

## 2012-01-05 ENCOUNTER — Encounter: Payer: Self-pay | Admitting: Family Medicine

## 2012-01-05 VITALS — BP 114/70 | HR 76 | Temp 98.3°F | Wt 241.2 lb

## 2012-01-05 DIAGNOSIS — R05 Cough: Secondary | ICD-10-CM

## 2012-01-05 DIAGNOSIS — J189 Pneumonia, unspecified organism: Secondary | ICD-10-CM

## 2012-01-05 MED ORDER — BENZONATATE 200 MG PO CAPS: 200.0000 mg | ORAL_CAPSULE | Freq: Three times a day (TID) | ORAL | Status: AC | PRN

## 2012-01-05 MED ORDER — TRIAMCINOLONE ACETONIDE(NASAL) 55 MCG/ACT NA INHA: 2.0000 | Freq: Every day | NASAL | Status: DC

## 2012-01-05 MED ORDER — AZITHROMYCIN 250 MG PO TABS: ORAL_TABLET | ORAL | Status: AC

## 2012-01-05 MED ORDER — TRIAMCINOLONE ACETONIDE(NASAL) 55 MCG/ACT NA INHA: 1.0000 | Freq: Every day | NASAL | Status: DC

## 2012-01-05 NOTE — Patient Instructions (Addendum)
Take the zithromax and use the tessalon for cough.  Take care. Let me know if you aren't better.

## 2012-01-05 NOTE — Progress Notes (Signed)
He got better from 12/12 illness.  He had some purulent/discolored rhinorrhea starting in early 1/13.  Now s/p second round of amoxil.  Discharge is now resolved. Still on nasacort, but still with nasal congestion/stuffy.  Cough with deep breath.  Some "wheeze", noisy breathing in upper airway, better today.  No fevers, but he'll feel hot and have sweats.  He still has some sinus pressure and fatigue. Cough is worse at night.   He was getting SOB with exertion and has stress test with cards next week.   Prev smoker.   Meds, vitals, and allergies reviewed.   ROS: See HPI.  Otherwise, noncontributory.  nad ncat Tm wnl Mild nasal irritation but no purulent discharge Mmm Neck supple, no la rrr Ctab, no wheeze, no focal dec in BS Ext well perfused.    CXR with patchy pneumonia left lower lobe.

## 2012-01-05 NOTE — Assessment & Plan Note (Signed)
New problem.  CXR reviewed.  Nontoxic. Okay for outpatient f/u.  PNA is not large enough to affect pulm exam, ie no rales detected.  I don't think we need to double cover.  No inc in wob.  Start zmax and use tessalon for cough.  He agrees.  CXR d/w pt.

## 2012-01-11 ENCOUNTER — Telehealth: Payer: Self-pay | Admitting: Family Medicine

## 2012-01-11 NOTE — Telephone Encounter (Signed)
Message copied by Lars Mage on Tue Jan 11, 2012 10:26 AM ------      Message from: Annamarie Major      Created: Tue Jan 11, 2012 10:06 AM                   ----- Message -----         From: Delilah Shan, CMA         Sent: 01/11/2012  10:05 AM           To: Delilah Shan, CMA            Feeling better.  Still has the dry, barking cough but he is doing okay and is at work.  He says he thinks it will just take some time for it to subside.        ----- Message -----         From: Delilah Shan, CMA         Sent: 01/10/2012   5:18 PM           To: Delilah Shan, CMA            Tried to phone patient, no answer, no A/M.      ----- Message -----         From: Joaquim Nam, MD         Sent: 01/07/2012           To: Delilah Shan, CMA            dx'd with PNA on Wed. Call and get update on patient.  Thanks.             Clelia Croft

## 2012-01-11 NOTE — Telephone Encounter (Signed)
Noted  

## 2012-01-13 ENCOUNTER — Ambulatory Visit (HOSPITAL_COMMUNITY): Payer: 59 | Admitting: Radiology

## 2012-01-24 ENCOUNTER — Ambulatory Visit (INDEPENDENT_AMBULATORY_CARE_PROVIDER_SITE_OTHER): Payer: 59 | Admitting: Physician Assistant

## 2012-01-24 ENCOUNTER — Telehealth: Payer: Self-pay | Admitting: Family Medicine

## 2012-01-24 DIAGNOSIS — R0602 Shortness of breath: Secondary | ICD-10-CM

## 2012-01-24 DIAGNOSIS — R9439 Abnormal result of other cardiovascular function study: Secondary | ICD-10-CM

## 2012-01-24 NOTE — Progress Notes (Addendum)
   [name] is a 61 y.o. male ex-smoker with a FHx of CAD (Mother with stent to LAD in her 62s).  He works in the cath lab.  Notes increase DOE over last several mos.  Spoke to Dr. Arvilla Meres and he suggested an ETT.  Of note, he was recently tx for community acquired pneumonia 01/05/12.  Exam notable for left basilar crackles, otherwise unremarkable.    Exercise Treadmill Test  Pre-Exercise Testing Evaluation Rhythm: normal sinus  Rate: 69   PR:  .16 QRS:  .09  QT:  .42 QTc: .45     Test  Exercise Tolerance Test Ordering MD: Arvilla Meres, MD  Interpreting MD:  Tereso Newcomer PA-C  Unique Test No: 1  Treadmill:  1  Indication for ETT: exertional dyspnea  Contraindication to ETT: No   Stress Modality: exercise - treadmill  Cardiac Imaging Performed: non   Protocol: standard Bruce - maximal  Max BP:  214/60  Max MPHR (bpm):  161 85% MPR (bpm):  137  MPHR obtained (bpm):  150 % MPHR obtained:  95%  Reached 85% MPHR (min:sec):  6:13 Total Exercise Time (min-sec):  7:00  Workload in METS:  8.5 Borg Scale: 15  Reason ETT Terminated:  patient's desire to stop    ST Segment Analysis At Rest: non-specific ST segment slurring With Exercise: Non-specific ST changes  Other Information Arrhythmia:  Occasional PVCs/PACs during exercise Angina during ETT:  absent (0) Quality of ETT:  Diagnostic  ETT Interpretation:  Normal  Comments: Fair exercise tolerance. No chest pain. He did complain of dyspnea. Normal BP response to exercise. No significant changes to suggest ischemia.   Recommendations: Suggest he follow up with PCP regarding recent pneumonia. Tereso Newcomer, PA-C  11:19 AM 01/24/2012     01/26/12:  Dr. Charlton Haws reviewed his ECGs and discussed with me.  Dr. Charlton Haws felt his stress test is normal and no further testing is needed.  Dr. Eden Emms has already spoken to the patient.  We originally set him up for an ETT-Myoview.  Dr. Arvilla Meres suggested we do a  cardiac CTA instead.  Both of these tests have been cancelled.  I have amended the ETT report to reflect that it is normal. Tereso Newcomer, PA-C  5:09 PM 01/26/2012

## 2012-01-24 NOTE — Telephone Encounter (Signed)
Triage Record Num: [id number] Operator: Jeraldine Loots Patient Name: [name] Call Date & Time: 01/24/2012 4:05:31PM Patient Phone: [phone] PCP: Crawford Givens Patient Gender: Male PCP Fax : Patient DOB: [birth year] Practice Name: [facility] Reason for Call: Caller: [name]/Patient; PCP: Crawford Givens Clelia Croft); CB#: [phone]; Was seen earlier today by his cardiologist and had a stress test. They could hear rales /rhonchi and suggested that he get a f/u appt. Was treated earlier this month for pneumonia. States he cannot feel any congestion, just has some occasional shortness of breath. He has to work tomorrow and unable to come in. No appt. for today. I called and spoke with Asher Muir. No [place] appt. available. Offered appt. later in the week. States that he cannot come in but will call if he worsens in anyway. Protocol(s) Used: Office Note Recommended Outcome per Protocol: Information Noted and Sent to Office Reason for Outcome: Caller information to office Care Advice: ~ 01/24/2012 4:13:43PM Page 1 of 1 [email or web address]

## 2012-01-25 NOTE — Telephone Encounter (Signed)
Please call him today and get update.  If not feeling progressively better today and through the next few days, then he needs f/u.  Thanks.

## 2012-01-25 NOTE — Telephone Encounter (Signed)
Patient called back spoke to carrie. I advised carrie to let him know he needed appt here or urgent care. Patient refuses because he has to work.

## 2012-01-25 NOTE — Telephone Encounter (Signed)
If he truly has a recurrent, symptomatic, or persistent PNA, then he needs to be rechecked either here or at [facility].  I don't it is prudent to call in more abx w/o recheck.

## 2012-01-25 NOTE — Telephone Encounter (Signed)
Tried to phone patient back at work number, he could not come to the phone at that time.  No A/M at home number.  No cell phone listed.

## 2012-01-25 NOTE — Telephone Encounter (Signed)
Patient says he tried to come in yesterday but he couldn't be seen. He works at the [employer] and had his Cardiologist to listen to his lungs.  He says he thinks he probably needs more ABX.  He says he is still coughing up stuff.  He cannot come in the office until next Tuesday.  He says he has to work every day until then.  If you will agree to call in more Z-Pac, he uses [facility].

## 2012-01-25 NOTE — Telephone Encounter (Signed)
The patient should be seen.  He has been advised to be seen here or at another location.

## 2012-01-26 ENCOUNTER — Other Ambulatory Visit: Payer: Self-pay | Admitting: *Deleted

## 2012-01-26 DIAGNOSIS — R9439 Abnormal result of other cardiovascular function study: Secondary | ICD-10-CM

## 2012-02-09 ENCOUNTER — Encounter (HOSPITAL_COMMUNITY): Payer: 59

## 2012-07-27 ENCOUNTER — Other Ambulatory Visit: Payer: Self-pay | Admitting: Family Medicine

## 2012-07-27 NOTE — Telephone Encounter (Signed)
Faxed refill request   

## 2012-07-27 NOTE — Addendum Note (Signed)
Addended by: Joaquim Nam on: 07/27/2012 10:39 PM   Modules accepted: Orders

## 2012-07-27 NOTE — Telephone Encounter (Signed)
I don't see a med refill to address, please clarify.

## 2012-07-28 ENCOUNTER — Other Ambulatory Visit: Payer: Self-pay | Admitting: *Deleted

## 2012-07-28 MED ORDER — TRIAMCINOLONE ACETONIDE(NASAL) 55 MCG/ACT NA INHA: 1.0000 | Freq: Every day | NASAL | Status: DC

## 2012-09-12 ENCOUNTER — Other Ambulatory Visit: Payer: Self-pay | Admitting: Cardiovascular Disease

## 2012-09-12 DIAGNOSIS — J329 Chronic sinusitis, unspecified: Secondary | ICD-10-CM

## 2012-09-12 MED ORDER — AMOXICILLIN-POT CLAVULANATE 500-125 MG PO TABS: 1.0000 | ORAL_TABLET | Freq: Three times a day (TID) | ORAL | Status: DC

## 2012-09-12 MED ORDER — ALBUTEROL SULFATE HFA 108 (90 BASE) MCG/ACT IN AERS: 2.0000 | INHALATION_SPRAY | Freq: Four times a day (QID) | RESPIRATORY_TRACT | Status: DC | PRN

## 2013-02-14 ENCOUNTER — Other Ambulatory Visit: Payer: Self-pay | Admitting: Family Medicine

## 2013-02-14 NOTE — Telephone Encounter (Signed)
Sent, needs CPE scheduled.  

## 2013-02-15 NOTE — Telephone Encounter (Signed)
Patient advised.

## 2013-02-28 ENCOUNTER — Telehealth: Payer: Self-pay | Admitting: Family Medicine

## 2013-02-28 NOTE — Telephone Encounter (Signed)
I'll bring in the orders today.

## 2013-02-28 NOTE — Telephone Encounter (Signed)
Patient has cpx appointment on 05/01/13.  Patient has his lab work done at [facility].  He would like the order mailed to him, so he can get the lab work done before his appointment.

## 2013-02-28 NOTE — Telephone Encounter (Signed)
Patient notified by telephone that script is being mailed to him.

## 2013-04-25 ENCOUNTER — Encounter: Payer: Self-pay | Admitting: Family Medicine

## 2013-05-01 ENCOUNTER — Ambulatory Visit (INDEPENDENT_AMBULATORY_CARE_PROVIDER_SITE_OTHER): Payer: 59 | Admitting: Family Medicine

## 2013-05-01 ENCOUNTER — Ambulatory Visit (INDEPENDENT_AMBULATORY_CARE_PROVIDER_SITE_OTHER)
Admission: RE | Admit: 2013-05-01 | Discharge: 2013-05-01 | Disposition: A | Discharge status: Home / Self Care | Payer: 59 | Source: Ambulatory Visit | Attending: Family Medicine | Admitting: Family Medicine

## 2013-05-01 ENCOUNTER — Encounter: Payer: Self-pay | Admitting: Family Medicine

## 2013-05-01 VITALS — BP 134/76 | HR 75 | Temp 97.6°F | Ht 73.0 in | Wt 245.0 lb

## 2013-05-01 DIAGNOSIS — R0789 Other chest pain: Secondary | ICD-10-CM

## 2013-05-01 DIAGNOSIS — Z0000 Encounter for general adult medical examination without abnormal findings: Secondary | ICD-10-CM

## 2013-05-01 DIAGNOSIS — Z23 Encounter for immunization: Secondary | ICD-10-CM

## 2013-05-01 MED ORDER — OMEPRAZOLE 20 MG PO CPDR: 20.0000 mg | DELAYED_RELEASE_CAPSULE | Freq: Every day | ORAL | Status: AC

## 2013-05-01 NOTE — Patient Instructions (Addendum)
Go to the lab on the way out.  We'll contact you with your xray report. See Shirlee Limerick about your referral before you leave today.  Check with your insurance to see if they will cover the shingles shot. Take prilosec 20mg  a day and avoid ibuprofen/aleve.  Take care.

## 2013-05-01 NOTE — Progress Notes (Signed)
CPE- See plan.  Routine anticipatory guidance given to patient.  See health maintenance. Tetanus 2014 Flu shot at work Shingles shot d/w pt.   PNA shot not due yet.   Colonoscopy 2010 PSA wnl.  We can consider recheck PSA next year.   Living will d/w pt.  He has one.  Would have his wife designated if incapacitated.   Diet and exercise d/w pt.  "basically nonexistent."   Plantar fasciitis has limited activity.  He has been injected by podiatry.  He is stretching and icing his foot.  We talked about trying to limit nsaids due to GERD sx.  He stopped nsaids and started prilosec with some relief.    In the last few weeks, he's had twinges of chest discomfort at rest with a "catch" during a deep breath.  No sx with exertion. It had a random onset.  He could get on the elliptical machine w/o sx.  He's had no sx in the last few days.  He now takes aleve rarely.  He isn't on prilosec now.  He doesn't have typical/anginal pains.   PMH and SH reviewed  Meds, vitals, and allergies reviewed.   ROS: See HPI.  Otherwise negative.    GEN: nad, alert and oriented HEENT: mucous membranes moist NECK: supple w/o LA CV: rrr. PULM: ctab, no inc wob ABD: soft, +bs EXT: no edema SKIN: no acute rash  EKG and CXR reviewed.

## 2013-05-03 DIAGNOSIS — R0789 Other chest pain: Secondary | ICD-10-CM | POA: Insufficient documentation

## 2013-05-03 DIAGNOSIS — Z0000 Encounter for general adult medical examination without abnormal findings: Secondary | ICD-10-CM | POA: Insufficient documentation

## 2013-05-03 NOTE — Assessment & Plan Note (Signed)
None at the time of exam.  Okay for outpatient f/u.  I would like given cards input for stress testing; he does have cardiomegaly noted on CXR.  He agrees with referral.

## 2013-05-03 NOTE — Assessment & Plan Note (Signed)
Routine anticipatory guidance given to patient.  See health maintenance. Tetanus 2014 Flu shot at work Shingles shot d/w pt.   PNA shot not due yet.   Colonoscopy 2010 PSA wnl.  We can consider recheck PSA next year.   Living will d/w pt.  He has one.  Would have his wife designated if incapacitated.   Diet and exercise d/w pt.  "basically nonexistent."   Plantar fasciitis has limited activity.  He has been injected by podiatry.  He is stretching and icing his foot.  We talked about trying to limit nsaids due to GERD sx.  He stopped nsaids and started prilosec with some relief.

## 2013-05-17 ENCOUNTER — Encounter: Payer: Self-pay | Admitting: Cardiovascular Disease

## 2013-05-17 ENCOUNTER — Ambulatory Visit (INDEPENDENT_AMBULATORY_CARE_PROVIDER_SITE_OTHER): Payer: 59 | Admitting: Cardiovascular Disease

## 2013-05-17 VITALS — BP 136/82 | HR 68 | Ht 73.0 in | Wt 247.5 lb

## 2013-05-17 DIAGNOSIS — R011 Cardiac murmur, unspecified: Secondary | ICD-10-CM | POA: Insufficient documentation

## 2013-05-17 DIAGNOSIS — R0789 Other chest pain: Secondary | ICD-10-CM

## 2013-05-17 DIAGNOSIS — R079 Chest pain, unspecified: Secondary | ICD-10-CM

## 2013-05-17 NOTE — Patient Instructions (Addendum)
Your physician has requested that you have an echocardiogram. Echocardiography is a painless test that uses sound waves to create images of your heart. It provides your doctor with information about the size and shape of your heart and how well your heart's chambers and valves are working. This procedure takes approximately one hour. There are no restrictions for this procedure.  Your physician has requested that you have an exercise tolerance test. For further information please visit www.[email or web address]. Please also follow instruction sheet, as given.   

## 2013-05-17 NOTE — Assessment & Plan Note (Signed)
The chest pain is overall atypical and likely related to GERD. Symptoms improved with the PPI. I do recommend further risk stratification with a treadmill stress test.

## 2013-05-17 NOTE — Assessment & Plan Note (Signed)
He has a cardiac murmur on physical exam of undetermined etiology. Patient's chest x-ray was also suggestive of cardiomegaly. Thus, I will obtain an echocardiogram for evaluation.

## 2013-05-17 NOTE — Progress Notes (Signed)
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 He is referred by Dr. Para March for evaluation of chest pain. He has no previous cardiac history. He reports having a stress test done a few years ago for chest pain and was overall unremarkable. He has no history of diabetes, hypertension or hyperlipidemia. He does have previous history of tobacco use and quit more than 10 years ago. He has no family history of premature coronary artery disease. He suffers from plantar fasciitis which requires periodic steroid injections. This limits his ability to exercise. He had recent episodes of substernal chest discomfort without radiation. This happened at rest and lasted for a few minutes. There was no worsening with physical activities. His symptoms improved after the addition of omeprazole. He denies any significant dyspnea, palpitations or dizziness.  No Known Allergies   Current Outpatient Prescriptions on File Prior to Visit  Medication Sig Dispense Refill  . aspirin 81 MG tablet Take 81 mg by mouth daily.        . fexofenadine (ALLEGRA) 180 MG tablet Take 180 mg by mouth daily.        . Multiple Vitamin (MULTIVITAMIN) tablet Take 1 tablet by mouth daily.        Marland Kitchen omeprazole (PRILOSEC) 20 MG capsule Take 1 capsule (20 mg total) by mouth daily.      Marland Kitchen triamcinolone (NASACORT) 55 MCG/ACT nasal inhaler PLACE 1 SPRAY INTO THE NOSE DAILY.  16.5 g  1   No current facility-administered medications on file prior to visit.     Past Medical History  Diagnosis Date  . Allergic rhinitis 04/2006  . Inflammatory polyps of colon with rectal bleeding     /colonoscopy 2004, 04/14/09  . GERD (gastroesophageal reflux disease)   . Plantar fasciitis      Past Surgical History  Procedure Laterality Date  . Cholecystectomy  1999  . Knee arthroscopy      left, multiple- 5 times, last 2006     Family History  Problem Relation Age of Onset  . Hypertension Mother     . Stroke Mother     multiple strokes  . Cancer Maternal Grandmother     uterine  . Diabetes Neg Hx   . Depression Neg Hx   . Colon cancer Neg Hx   . Prostate cancer Neg Hx      History   Social History  . Marital Status: Married    Spouse Name: N/A    Number of Children: 2  . Years of Education: N/A   Occupational History  . cardiovasc tech    Social History Main Topics  . Smoking status: Former Smoker -- 1.00 packs/day for 30 years    Types: Cigarettes    Quit date: 11/29/2002  . Smokeless tobacco: Never Used  . Alcohol Use: 0.5 oz/week    1 drink(s) per week     Comment: rarely  . Drug Use: No  . Sexually Active: Yes   Other Topics Concern  . Not on file   Social History Narrative   Married 1975, lives with wife.   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  HENT: Negative for hearing loss, nosebleeds, congestion, sore throat, facial swelling, drooling, trouble swallowing, neck pain, voice change, sinus pressure and tinnitus.  Eyes: Negative for  photophobia, pain, discharge and visual disturbance.  Respiratory: Negative for apnea, cough shortness of breath and wheezing.  Cardiovascular: Negative for  palpitations and leg swelling.  Gastrointestinal: Negative for nausea, vomiting, abdominal pain, diarrhea, constipation, blood in stool and abdominal distention.  Genitourinary: Negative for dysuria, urgency, frequency, hematuria and decreased urine volume.  Musculoskeletal: Negative for myalgias, back pain, joint swelling, arthralgias and gait problem.  Skin: Negative for color change, pallor, rash and wound.  Neurological: Negative for dizziness, tremors, seizures, syncope, speech difficulty, weakness, light-headedness, numbness and headaches.  Psychiatric/Behavioral: Negative for suicidal ideas, hallucinations, behavioral problems and  agitation. The patient is not nervous/anxious.     PHYSICAL EXAM   BP 136/82  Pulse 68  Ht 6\' 1"  (1.854 m)  Wt 247 lb 8 oz (112.265 kg)  BMI 32.66 kg/m2 Constitutional: He is oriented to person, place, and time. He appears well-developed and well-nourished. No distress.  HENT: No nasal discharge.  Head: Normocephalic and atraumatic.  Eyes: Pupils are equal and round. Right eye exhibits no discharge. Left eye exhibits no discharge.  Neck: Normal range of motion. Neck supple. No JVD present. No thyromegaly present.  Cardiovascular: Normal rate, regular rhythm, normal heart sounds and. Exam reveals no gallop and no friction rub. There is a 2/6 systolic ejection murmur at the base and the left sternal border. Pulmonary/Chest: Effort normal and breath sounds normal. No stridor. No respiratory distress. He has no wheezes. He has no rales. He exhibits no tenderness.  Abdominal: Soft. Bowel sounds are normal. He exhibits no distension. There is no tenderness. There is no rebound and no guarding.  Musculoskeletal: Normal range of motion. He exhibits no edema and no tenderness.  Neurological: He is alert and oriented to person, place, and time. Coordination normal.  Skin: Skin is warm and dry. No rash noted. He is not diaphoretic. No erythema. No pallor.  Psychiatric: He has a normal mood and affect. His behavior is normal. Judgment and thought content normal.       ZOX:WRUEA  Rhythm  -Prominent R(V1) -nonspecific.   BORDERLINE   ASSESSMENT AND PLAN

## 2013-06-05 ENCOUNTER — Encounter: Payer: Self-pay | Admitting: Cardiovascular Disease

## 2013-06-05 ENCOUNTER — Other Ambulatory Visit (INDEPENDENT_AMBULATORY_CARE_PROVIDER_SITE_OTHER): Payer: 59

## 2013-06-05 ENCOUNTER — Ambulatory Visit (INDEPENDENT_AMBULATORY_CARE_PROVIDER_SITE_OTHER): Payer: 59 | Admitting: Cardiovascular Disease

## 2013-06-05 ENCOUNTER — Other Ambulatory Visit: Payer: Self-pay

## 2013-06-05 DIAGNOSIS — R079 Chest pain, unspecified: Secondary | ICD-10-CM

## 2013-06-05 DIAGNOSIS — R011 Cardiac murmur, unspecified: Secondary | ICD-10-CM

## 2013-06-05 NOTE — Patient Instructions (Addendum)
Your stress test is normal.  Echo showed very mild aortic stenosis and mild regurgitation. Recommend a follow up echocardiogram in 2-3 years.

## 2013-06-05 NOTE — Procedures (Signed)
    Treadmill Stress test  Indication: Atypical chest pain.  Baseline Data:  Resting EKG shows NSR with rate of 74 bpm, no significant ST or T wave changes  Resting blood pressure of 13 8/72 mm Hg Stand bruce protocal was used.  Exercise Data:  Patient exercised for 7 min 0 sec,  Peak heart rate of 176 bpm.  This was 111 % of the maximum predicted heart rate. No symptoms of chest pain or lightheadedness were reported at peak stress or in recovery.  Peak Blood pressure recorded was 190/70 Maximal work level: 10.1 METs.  Heart rate at 3 minutes in recovery was 88 bpm. BP response: Mildly hypotensive HR response: Accelerated due to a short run of SVT  EKG with Exercise: Sinus tachycardia which progressed to a brief run of SVT with no significant ST changes. The SVT resolved without intervention within 1 minute recovery  FINAL IMPRESSION: Normal exercise stress test. No significant EKG changes concerning for ischemia. Average exercise tolerance. A short run of supraventricular tachycardia noted at peak exercise which resolved without intervention.  Recommendation: No chest pain during the stress test. The symptoms are likely noncardiac. He does not seem to have symptoms of palpitations and thus, no need to treat his SVT at the present time.

## 2013-06-06 ENCOUNTER — Other Ambulatory Visit: Payer: Self-pay | Admitting: *Deleted

## 2013-06-06 MED ORDER — TRIAMCINOLONE ACETONIDE(NASAL) 55 MCG/ACT NA INHA: NASAL | Status: AC

## 2013-06-18 ENCOUNTER — Telehealth: Payer: Self-pay

## 2013-06-18 MED ORDER — ZOSTER VACCINE LIVE 19400 UNT/0.65ML ~~LOC~~ SOLR: 0.6500 mL | Freq: Once | SUBCUTANEOUS | Status: DC

## 2013-06-18 NOTE — Telephone Encounter (Signed)
Pt left v/m requesting a prescription for shingles vaccine mailed to [address] [place] [address].

## 2013-06-18 NOTE — Telephone Encounter (Signed)
Printed.  Please send.  Thanks.

## 2013-06-19 NOTE — Telephone Encounter (Signed)
Mailed

## 2014-02-18 ENCOUNTER — Encounter: Payer: Self-pay | Admitting: Gastroenterology

## 2014-02-25 ENCOUNTER — Emergency Department (INDEPENDENT_AMBULATORY_CARE_PROVIDER_SITE_OTHER): Payer: 59

## 2014-02-25 ENCOUNTER — Emergency Department (HOSPITAL_COMMUNITY)
Admission: EM | Admit: 2014-02-25 | Discharge: 2014-02-25 | Disposition: A | Discharge status: Home / Self Care | Payer: 59 | Source: Home / Self Care | Attending: Emergency Medicine | Admitting: Emergency Medicine

## 2014-02-25 ENCOUNTER — Encounter (HOSPITAL_COMMUNITY): Payer: Self-pay | Admitting: Emergency Medicine

## 2014-02-25 DIAGNOSIS — J019 Acute sinusitis, unspecified: Secondary | ICD-10-CM

## 2014-02-25 DIAGNOSIS — J309 Allergic rhinitis, unspecified: Secondary | ICD-10-CM

## 2014-02-25 MED ORDER — METHYLPREDNISOLONE ACETATE 80 MG/ML IJ SUSP
INTRAMUSCULAR | Status: AC
  Filled 2014-02-25: qty 1

## 2014-02-25 MED ORDER — PREDNISONE 20 MG PO TABS: ORAL_TABLET | ORAL | Status: DC

## 2014-02-25 MED ORDER — METHYLPREDNISOLONE ACETATE 80 MG/ML IJ SUSP
80.0000 mg | Freq: Once | INTRAMUSCULAR | Status: AC
  Administered 2014-02-25: 80 mg via INTRAMUSCULAR

## 2014-02-25 MED ORDER — AMOXICILLIN-POT CLAVULANATE 875-125 MG PO TABS
1.0000 | ORAL_TABLET | Freq: Two times a day (BID) | ORAL | Status: DC
Start: 2014-02-25 — End: 2014-06-05

## 2014-02-25 NOTE — ED Notes (Signed)
Pt  Reports  Symptoms  Of  Sinus  Drainage   Nasal  Congestion        Hoarse  And  A  sorethroat  With   Symptoms  For  About 1  Month     He  Reports     Recently  Finished a  z  Pack  Which  helpped  But  Symptoms  reoccured

## 2014-02-25 NOTE — Discharge Instructions (Signed)
Allergic Rhinitis Allergic rhinitis is when the mucous membranes in the nose respond to allergens. Allergens are particles in the air that cause your body to have an allergic reaction. This causes you to release allergic antibodies. Through a chain of events, these eventually cause you to release histamine into the blood stream. Although meant to protect the body, it is this release of histamine that causes your discomfort, such as frequent sneezing, congestion, and an itchy, runny nose.  CAUSES  Seasonal allergic rhinitis (hay fever) is caused by pollen allergens that may come from grasses, trees, and weeds. Year-round allergic rhinitis (perennial allergic rhinitis) is caused by allergens such as house dust mites, pet dander, and mold spores.  SYMPTOMS   Nasal stuffiness (congestion).  Itchy, runny nose with sneezing and tearing of the eyes. DIAGNOSIS  Your health care provider can help you determine the allergen or allergens that trigger your symptoms. If you and your health care provider are unable to determine the allergen, skin or blood testing may be used. TREATMENT  Allergic Rhinitis does not have a cure, but it can be controlled by:  Medicines and allergy shots (immunotherapy).  Avoiding the allergen. Hay fever may often be treated with antihistamines in pill or nasal spray forms. Antihistamines block the effects of histamine. There are over-the-counter medicines that may help with nasal congestion and swelling around the eyes. Check with your health care provider before taking or giving this medicine.  If avoiding the allergen or the medicine prescribed do not work, there are many new medicines your health care provider can prescribe. Stronger medicine may be used if initial measures are ineffective. Desensitizing injections can be used if medicine and avoidance does not work. Desensitization is when a patient is given ongoing shots until the body becomes less sensitive to the allergen.  Make sure you follow up with your health care provider if problems continue. HOME CARE INSTRUCTIONS It is not possible to completely avoid allergens, but you can reduce your symptoms by taking steps to limit your exposure to them. It helps to know exactly what you are allergic to so that you can avoid your specific triggers. SEEK MEDICAL CARE IF:   You have a fever.  You develop a cough that does not stop easily (persistent).  You have shortness of breath.  You start wheezing.  Symptoms interfere with normal daily activities. Document Released: 08/10/2001 Document Revised: 09/05/2013 Document Reviewed: 07/23/2013 [facility] Patient Information 2014 Reform.  Sinusitis Sinusitis is redness, soreness, and swelling (inflammation) of the paranasal sinuses. Paranasal sinuses are air pockets within the bones of your face (beneath the eyes, the middle of the forehead, or above the eyes). In healthy paranasal sinuses, mucus is able to drain out, and air is able to circulate through them by way of your nose. However, when your paranasal sinuses are inflamed, mucus and air can become trapped. This can allow bacteria and other germs to grow and cause infection. Sinusitis can develop quickly and last only a short time (acute) or continue over a long period (chronic). Sinusitis that lasts for more than 12 weeks is considered chronic.  CAUSES  Causes of sinusitis include:  Allergies.  Structural abnormalities, such as displacement of the cartilage that separates your nostrils (deviated septum), which can decrease the air flow through your nose and sinuses and affect sinus drainage.  Functional abnormalities, such as when the small hairs (cilia) that line your sinuses and help remove mucus do not work properly or are not present.  SYMPTOMS  °Symptoms of acute and chronic sinusitis are the same. The primary symptoms are pain and pressure around the affected sinuses. Other symptoms include: °· Upper  toothache. °· Earache. °· Headache. °· Bad breath. °· Decreased sense of smell and taste. °· A cough, which worsens when you are lying flat. °· Fatigue. °· Fever. °· Thick drainage from your nose, which often is green and may contain pus (purulent). °· Swelling and warmth over the affected sinuses. °DIAGNOSIS  °Your caregiver will perform a physical exam. During the exam, your caregiver may: °· Look in your nose for signs of abnormal growths in your nostrils (nasal polyps). °· Tap over the affected sinus to check for signs of infection. °· View the inside of your sinuses (endoscopy) with a special imaging device with a light attached (endoscope), which is inserted into your sinuses. °If your caregiver suspects that you have chronic sinusitis, one or more of the following tests may be recommended: °· Allergy tests. °· Nasal culture A sample of mucus is taken from your nose and sent to a lab and screened for bacteria. °· Nasal cytology A sample of mucus is taken from your nose and examined by your caregiver to determine if your sinusitis is related to an allergy. °TREATMENT  °Most cases of acute sinusitis are related to a viral infection and will resolve on their own within 10 days. Sometimes medicines are prescribed to help relieve symptoms (pain medicine, decongestants, nasal steroid sprays, or saline sprays).  °However, for sinusitis related to a bacterial infection, your caregiver will prescribe antibiotic medicines. These are medicines that will help kill the bacteria causing the infection.  °Rarely, sinusitis is caused by a fungal infection. In theses cases, your caregiver will prescribe antifungal medicine. °For some cases of chronic sinusitis, surgery is needed. Generally, these are cases in which sinusitis recurs more than 3 times per year, despite other treatments. °HOME CARE INSTRUCTIONS  °· Drink plenty of water. Water helps thin the mucus so your sinuses can drain more easily. °· Use a  humidifier. °· Inhale steam 3 to 4 times a day (for example, sit in the bathroom with the shower running). °· Apply a warm, moist washcloth to your face 3 to 4 times a day, or as directed by your caregiver. °· Use saline nasal sprays to help moisten and clean your sinuses. °· Take over-the-counter or prescription medicines for pain, discomfort, or fever only as directed by your caregiver. °SEEK IMMEDIATE MEDICAL CARE IF: °· You have increasing pain or severe headaches. °· You have nausea, vomiting, or drowsiness. °· You have swelling around your face. °· You have vision problems. °· You have a stiff neck. °· You have difficulty breathing. °MAKE SURE YOU:  °· Understand these instructions. °· Will watch your condition. °· Will get help right away if you are not doing well or get worse. °Document Released: 11/15/2005 Document Revised: 02/07/2012 Document Reviewed: 11/30/2011 °ExitCare® Patient Information ©2014 [employer]. ° °

## 2014-02-25 NOTE — ED Provider Notes (Signed)
Chief Complaint   Chief Complaint  Patient presents with  . URI    History of Present Illness   [name] is a 63 year old male who's had a 4 to five-week history of sore throat, hoarseness, dry cough, chest tightness, nasal congestion with yellow drainage, sinus pressure, and right ear pressure. He has a history of allergies and takes Allegra and Nasonex nasal spray. He denies fever, chills, or GI symptoms. He has a history of pneumonia in the past.  Review of Systems   Other than as noted above, the patient denies any of the following symptoms: Systemic:  No fevers, chills, sweats, or myalgias. Eye:  No redness or discharge. ENT:  No ear pain or headache. Neck:  No neck pain, stiffness, or swollen glands. Lungs:  No sputum production, hemoptysis, wheezing, shortness of breath or chest pain. GI:  No abdominal pain, nausea, vomiting or diarrhea.  [place]   Past medical history, family history, social history, meds, and allergies were reviewed.   Physical exam   Vital signs:  BP 142/95  Pulse 105  Temp(Src) 98.3 F (36.8 C) (Oral)  Resp 18  SpO2 95% General:  Alert and oriented.  In no distress.  Skin warm and dry. Eye:  No conjunctival injection or drainage. Lids were normal. ENT:  TMs and canals were normal, without erythema or inflammation.  Nasal mucosa was congested without drainage.  Mucous membranes were moist.  Pharynx was erythematous with no exudate or drainage.  There were no oral ulcerations or lesions. Neck:  Supple, no adenopathy, tenderness or mass. Lungs:  No respiratory distress.  Lungs were clear to auscultation, without wheezes, rales or rhonchi.  Breath sounds were clear and equal bilaterally.  Heart:  Regular rhythm, without gallops, murmers or rubs. Skin:  Clear, warm, and dry, without rash or lesions.  Radiology   Dg Chest 2 View  02/25/2014   CLINICAL DATA:  Cough and congestion  EXAM: CHEST  2 VIEW  COMPARISON:  05/01/2013  FINDINGS: Cardiac  shadow is stable. The lungs are well aerated bilaterally. No focal infiltrate or sizable effusion is seen. No acute bony abnormality is noted.  IMPRESSION: No active cardiopulmonary disease.   Electronically Signed   By: Inez Catalina M.D.   On: 02/25/2014 11:39   Course in [facility]   Given Depo-Medrol 80 mg IM.  Assessment     The primary encounter diagnosis was Acute sinusitis. A diagnosis of Allergic rhinitis was also pertinent to this visit.  Plan    1.  Meds:  The following meds were prescribed:   Discharge Medication List as of 02/25/2014 11:54 AM    START taking these medications   Details  amoxicillin-clavulanate (AUGMENTIN) 875-125 MG per tablet Take 1 tablet by mouth 2 (two) times daily., Starting 02/25/2014, Until Discontinued, Normal    predniSONE (DELTASONE) 20 MG tablet Take 3 daily for 5 days, 2 daily for 5 days, 1 daily for 5 days., Normal        2.  Patient Education/Counseling:  The patient was given appropriate handouts, self care instructions, and instructed in symptomatic relief.  Instructed to get extra fluids, rest, and use a cool mist vaporizer.    3.  Follow up:  The patient was told to follow up here if no better in 3 to 4 days, or sooner if becoming worse in any way, and given some red flag symptoms such as increasing fever, difficulty breathing, chest pain, or persistent vomiting which would prompt  immediate return.  Follow up here as needed.      Harden Mo, MD 02/25/14 1225

## 2014-04-17 ENCOUNTER — Encounter: Payer: Self-pay | Admitting: Gastroenterology

## 2014-04-20 ENCOUNTER — Ambulatory Visit: Payer: Self-pay | Admitting: Physician Assistant

## 2014-06-05 ENCOUNTER — Encounter: Payer: Self-pay | Admitting: Family Medicine

## 2014-06-05 ENCOUNTER — Ambulatory Visit (INDEPENDENT_AMBULATORY_CARE_PROVIDER_SITE_OTHER): Payer: 59 | Admitting: Family Medicine

## 2014-06-05 VITALS — BP 148/84 | HR 80 | Temp 98.0°F | Wt 248.0 lb

## 2014-06-05 DIAGNOSIS — R059 Cough, unspecified: Secondary | ICD-10-CM

## 2014-06-05 DIAGNOSIS — R05 Cough: Secondary | ICD-10-CM

## 2014-06-05 MED ORDER — AZITHROMYCIN 250 MG PO TABS: ORAL_TABLET | ORAL | Status: DC

## 2014-06-05 MED ORDER — ZOSTER VACCINE LIVE 19400 UNT/0.65ML ~~LOC~~ SOLR: 0.6500 mL | Freq: Once | SUBCUTANEOUS | Status: DC

## 2014-06-05 MED ORDER — ALBUTEROL SULFATE HFA 108 (90 BASE) MCG/ACT IN AERS: 1.0000 | INHALATION_SPRAY | Freq: Four times a day (QID) | RESPIRATORY_TRACT | Status: DC | PRN

## 2014-06-05 NOTE — Progress Notes (Signed)
Pre visit review using our clinic review tool, if applicable. No additional management support is needed unless otherwise documented below in the visit note.  Saturday sx started.  Had been cutting grass.  When to bed, had persistent chest tightness.  Worse in the meantime, more cough.  Cough is dry.  Cough triggered by a deep breath.  Some wheeze with coughing fits.  No vomiting.  No diarrhea.  No sick contacts.  Noted fevers, not in the last 24 hours, up to 101. Muscles aching, fatigued.  Sleep disrupted.  No ear pain.  No ST.    Meds, vitals, and allergies reviewed.   ROS: See HPI.  Otherwise, noncontributory.  GEN: nad, alert and oriented HEENT: mucous membranes moist, tm w/o erythema, nasal exam w/o erythema, scant clear discharge noted,  OP with minimal lcobblestoning NECK: supple w/o LA CV: rrr.   PULM: ctab except for scant exp rhonchi in the LLL, no inc wob EXT: no edema SKIN: no acute rash

## 2014-06-05 NOTE — Patient Instructions (Signed)
Start the antibiotics today and use the inhaler as needed.  Take care.  Glad to see you.

## 2014-06-06 DIAGNOSIS — R059 Cough, unspecified: Secondary | ICD-10-CM | POA: Insufficient documentation

## 2014-06-06 DIAGNOSIS — R05 Cough: Secondary | ICD-10-CM | POA: Insufficient documentation

## 2014-06-06 NOTE — Assessment & Plan Note (Signed)
No wheeze now  Would use SABA prn cough/wheeze if noted and start abx given the asymmetry on exam.  He agrees. Okay for outpatient f/u.  Nontoxic.

## 2014-06-11 ENCOUNTER — Ambulatory Visit (AMBULATORY_SURGERY_CENTER): Payer: 59

## 2014-06-11 VITALS — Ht 72.0 in | Wt 250.0 lb

## 2014-06-11 DIAGNOSIS — Z860100 Personal history of colon polyps, unspecified: Secondary | ICD-10-CM

## 2014-06-11 MED ORDER — MOVIPREP 100 G PO SOLR: 1.0000 | Freq: Once | ORAL | Status: DC

## 2014-06-11 NOTE — Progress Notes (Signed)
No allergies to eggs or soy No home oxygen No diet/weight loss meds No past problems with anesthesia No relatives with problems with anesthesia  Has email  Emmi instructions given for colonoscopy

## 2014-06-19 ENCOUNTER — Ambulatory Visit (INDEPENDENT_AMBULATORY_CARE_PROVIDER_SITE_OTHER): Payer: 59 | Admitting: Family Medicine

## 2014-06-19 ENCOUNTER — Encounter: Payer: Self-pay | Admitting: Family Medicine

## 2014-06-19 VITALS — BP 124/80 | HR 100 | Temp 98.0°F | Wt 243.2 lb

## 2014-06-19 DIAGNOSIS — K5289 Other specified noninfective gastroenteritis and colitis: Secondary | ICD-10-CM

## 2014-06-19 DIAGNOSIS — R197 Diarrhea, unspecified: Secondary | ICD-10-CM

## 2014-06-19 DIAGNOSIS — K529 Noninfective gastroenteritis and colitis, unspecified: Secondary | ICD-10-CM

## 2014-06-19 MED ORDER — ONDANSETRON HCL 4 MG PO TABS: 4.0000 mg | ORAL_TABLET | Freq: Three times a day (TID) | ORAL | Status: DC | PRN

## 2014-06-19 NOTE — Progress Notes (Signed)
Pre visit review using our clinic review tool, if applicable. No additional management support is needed unless otherwise documented below in the visit note.  Since the last OV, he started the abx and started using albuterol.  Laying down the cough got worse, took some OTC dextromethorphan with some partial relief.  He was done with the abx but would still get a cough with a deep breath.  Travelled to [place], had diarrhea on the way.  He had been off the abx for 4 days before the diarrhea started.  Had profuse vomiting and diarrhea while in [place], up to 10 BMs initially.  Worked to stay hydrated.  The vomiting got better before the diarrhea relented.  She went to watch a relative's softball game, tried to walk and got sweaty, lightheaded, presyncopal.  Went down on his knees and started vomiting again.  Seen at [facility] in [place].  He was given phenergan and he took some imodium. Vomiting relented but still with nausea now.  He slept the entire drive back this past weekend.    The diarrhea was initially pure liquid, not solid but less liquid now.    Meds, vitals, and allergies reviewed.   ROS: See HPI.  Otherwise, noncontributory.  GEN: nad, alert and oriented HEENT: mucous membranes moist, TM wnl. Nasal exam wnl but posterior OP irritation noted.  NECK: supple w/o LA CV: rrr.   PULM: ctab, no inc wob ABD: soft, +bs EXT: no edema SKIN: no acute rash, normal cap refill.

## 2014-06-19 NOTE — Patient Instructions (Signed)
Go to the lab on the way out.  We'll contact you with your lab report.  Take zofran as needed for nausea.  Drink enough fluids to keep your urine clear or light colored.  Call the GI clinic with an update on Friday about your possible colonoscopy.  Take care.  This should get better soon.

## 2014-06-20 ENCOUNTER — Telehealth: Payer: Self-pay | Admitting: Gastroenterology

## 2014-06-20 DIAGNOSIS — K529 Noninfective gastroenteritis and colitis, unspecified: Secondary | ICD-10-CM | POA: Insufficient documentation

## 2014-06-20 NOTE — Assessment & Plan Note (Signed)
Chest now clear.  Likely viral, present in community recently.  Likely not c diff or abx related.   D/w pt. Nontoxic.  Should resolve.  Avoid dairy for now.  zofran prn, fluids PO o/w.  He agrees. F/u prn.  Chest CTAB now.

## 2014-06-20 NOTE — Telephone Encounter (Signed)
Pt aware that he should still be able to have the procedure as planned

## 2014-06-21 ENCOUNTER — Encounter: Payer: Self-pay | Admitting: Gastroenterology

## 2014-06-21 LAB — BASIC METABOLIC PANEL
BUN / CREAT RATIO: 16 (ref 10–22)
BUN: 18 mg/dL (ref 8–27)
CHLORIDE: 96 mmol/L — AB (ref 97–108)
CO2: 23 mmol/L (ref 18–29)
Calcium: 8.4 mg/dL — ABNORMAL LOW (ref 8.6–10.2)
Creatinine, Ser: 1.16 mg/dL (ref 0.76–1.27)
GFR calc non Af Amer: 67 mL/min/{1.73_m2} (ref >59)
GFR, EST AFRICAN AMERICAN: 78 mL/min/{1.73_m2} (ref >59)
Glucose: 111 mg/dL — ABNORMAL HIGH (ref 65–99)
Potassium: 3.8 mmol/L (ref 3.5–5.2)
Sodium: 137 mmol/L (ref 134–144)

## 2014-06-21 LAB — CBC WITH DIFFERENTIAL
BASOS ABS: 0.1 10*3/uL (ref 0.0–0.2)
Basos: 1 %
Eos: 3 %
Eosinophils Absolute: 0.4 10*3/uL (ref 0.0–0.4)
HEMATOCRIT: 52 % — AB (ref 37.5–51.0)
Hemoglobin: 18.6 g/dL — ABNORMAL HIGH (ref 12.6–17.7)
Immature Grans (Abs): 0 10*3/uL (ref 0.0–0.1)
Immature Granulocytes: 0 %
LYMPHS ABS: 3.1 10*3/uL (ref 0.7–3.1)
Lymphs: 23 %
MCH: 32.9 pg (ref 26.6–33.0)
MCHC: 35.8 g/dL — ABNORMAL HIGH (ref 31.5–35.7)
MCV: 92 fL (ref 79–97)
MONOCYTES: 9 %
MONOS ABS: 1.1 10*3/uL — AB (ref 0.1–0.9)
Neutrophils Absolute: 8.6 10*3/uL — ABNORMAL HIGH (ref 1.4–7.0)
Neutrophils Relative %: 64 %
Platelets: 378 10*3/uL (ref 150–379)
RBC: 5.65 x10E6/uL (ref 4.14–5.80)
RDW: 12.8 % (ref 12.3–15.4)
WBC: 13.5 10*3/uL — ABNORMAL HIGH (ref 3.4–10.8)

## 2014-06-25 ENCOUNTER — Encounter: Payer: Self-pay | Admitting: Gastroenterology

## 2014-06-25 ENCOUNTER — Ambulatory Visit (AMBULATORY_SURGERY_CENTER): Payer: 59 | Admitting: Gastroenterology

## 2014-06-25 VITALS — BP 118/44 | HR 71 | Temp 97.3°F | Resp 18 | Ht 72.0 in | Wt 250.0 lb

## 2014-06-25 DIAGNOSIS — D126 Benign neoplasm of colon, unspecified: Secondary | ICD-10-CM

## 2014-06-25 DIAGNOSIS — Z86010 Personal history of colonic polyps: Secondary | ICD-10-CM

## 2014-06-25 MED ORDER — SODIUM CHLORIDE 0.9 % IV SOLN: 500.0000 mL | INTRAVENOUS | Status: DC

## 2014-06-25 NOTE — Progress Notes (Signed)
Called to room to assist during endoscopic procedure.  Patient ID and intended procedure confirmed with present staff. Received instructions for my participation in the procedure from the performing physician.  

## 2014-06-25 NOTE — Patient Instructions (Signed)
YOU HAD AN ENDOSCOPIC PROCEDURE TODAY AT THE [facility]: Refer to the procedure report that was given to you for any specific questions about what was found during the examination.  If the procedure report does not answer your questions, please call your gastroenterologist to clarify.  If you requested that your care partner not be given the details of your procedure findings, then the procedure report has been included in a sealed envelope for you to review at your convenience later.  YOU SHOULD EXPECT: Some feelings of bloating in the abdomen. Passage of more gas than usual.  Walking can help get rid of the air that was put into your GI tract during the procedure and reduce the bloating. If you had a lower endoscopy (such as a colonoscopy or flexible sigmoidoscopy) you may notice spotting of blood in your stool or on the toilet paper. If you underwent a bowel prep for your procedure, then you may not have a normal bowel movement for a few days.  DIET: Your first meal following the procedure should be a light meal and then it is ok to progress to your normal diet.  A half-sandwich or bowl of soup is an example of a good first meal.  Heavy or fried foods are harder to digest and may make you feel nauseous or bloated.  Likewise meals heavy in dairy and vegetables can cause extra gas to form and this can also increase the bloating.  Drink plenty of fluids but you should avoid alcoholic beverages for 24 hours.  ACTIVITY: Your care partner should take you home directly after the procedure.  You should plan to take it easy, moving slowly for the rest of the day.  You can resume normal activity the day after the procedure however you should NOT DRIVE or use heavy machinery for 24 hours (because of the sedation medicines used during the test).    SYMPTOMS TO REPORT IMMEDIATELY: A gastroenterologist can be reached at any hour.  During normal business hours, 8:30 AM to 5:00 PM Monday through Friday,  call [phone].  After hours and on weekends, please call the GI answering service at [phone] who will take a message and have the physician on call contact you.   Following lower endoscopy (colonoscopy or flexible sigmoidoscopy):  Excessive amounts of blood in the stool  Significant tenderness or worsening of abdominal pains  Swelling of the abdomen that is new, acute  Fever of 100F or higher  FOLLOW UP: If any biopsies were taken you will be contacted by phone or by letter within the next 1-3 weeks.  Call your gastroenterologist if you have not heard about the biopsies in 3 weeks.  Our staff will call the home number listed on your records the next business day following your procedure to check on you and address any questions or concerns that you may have at that time regarding the information given to you following your procedure. This is a courtesy call and so if there is no answer at the home number and we have not heard from you through the emergency physician on call, we will assume that you have returned to your regular daily activities without incident.  SIGNATURES/CONFIDENTIALITY: You and/or your care partner have signed paperwork which will be entered into your electronic medical record.  These signatures attest to the fact that that the information above on your After Visit Summary has been reviewed and is understood.  Full responsibility of the confidentiality of this   discharge information lies with you and/or your care-partner.  Await pathology  Please read over handouts about polyps  Continue your normal medications

## 2014-06-25 NOTE — Op Note (Signed)
[place]  [address] [place], [address]   COLONOSCOPY PROCEDURE REPORT  PATIENT: [name], Severe  MR#: [id number] BIRTHDATE: May 09, 1951 , 63  yrs. old GENDER: Male ENDOSCOPIST: Milus Banister, MD PROCEDURE DATE:  06/25/2014 PROCEDURE:   Colonoscopy with snare polypectomy First Screening Colonoscopy - Avg.  risk and is 50 yrs.  old or older - No.  Prior Negative Screening - Now for repeat screening. N/A  History of Adenoma - Now for follow-up colonoscopy & has been > or = to 3 yrs.  Yes hx of adenoma.  Has been 3 or more years since last colonoscopy.  Polyps Removed Today? Yes. ASA CLASS:   Class II INDICATIONS:Single small adenoma removed 2010. MEDICATIONS: propofol (Diprivan) 250mg  IV and MAC sedation, administered by CRNA  DESCRIPTION OF PROCEDURE:   After the risks benefits and alternatives of the procedure were thoroughly explained, informed consent was obtained.  A digital rectal exam revealed no abnormalities of the rectum.   The LB [id number]  endoscope was introduced through the anus and advanced to the cecum, which was identified by both the appendix and ileocecal valve. No adverse events experienced.   The quality of the prep was good.  The instrument was then slowly withdrawn as the colon was fully examined.  COLON FINDINGS: Three polyps were found, removed and sent to pathology.  These were all sessile, measured 2-31mm across, located in transverse and sigmoid segments, removed with cold snare.  The examination was otherwise normal.  Retroflexed views revealed no abnormalities. The time to cecum=2 minutes 59 seconds.  Withdrawal time=10 minutes 15 seconds.  The scope was withdrawn and the procedure completed. COMPLICATIONS: There were no complications.  ENDOSCOPIC IMPRESSION: Three polyps were found, removed and sent to pathology. The examination was otherwise normal.  RECOMMENDATIONS: If the polyp(s) removed today are proven to  be adenomatous (pre-cancerous) polyps, you will need a colonoscopy in 3-5 years. You will receive a letter within 1-2 weeks with the results of your biopsy as well as final recommendations.  Please call my office if you have not received a letter after 3 weeks.   eSigned:  Milus Banister, MD 06/25/2014 8:48 AM   cc: Elsie Stain, MD

## 2014-06-25 NOTE — Progress Notes (Signed)
Patient awakening,vss,report to rn 

## 2014-06-26 ENCOUNTER — Telehealth: Payer: Self-pay

## 2014-06-26 NOTE — Telephone Encounter (Signed)
Left a message on the answering machine [phone] to call us back if any questions or concerns. Maw

## 2014-07-02 ENCOUNTER — Encounter: Payer: Self-pay | Admitting: Gastroenterology

## 2014-07-10 ENCOUNTER — Ambulatory Visit (INDEPENDENT_AMBULATORY_CARE_PROVIDER_SITE_OTHER)
Admission: RE | Admit: 2014-07-10 | Discharge: 2014-07-10 | Disposition: A | Discharge status: Home / Self Care | Payer: 59 | Source: Ambulatory Visit | Attending: Family Medicine | Admitting: Family Medicine

## 2014-07-10 ENCOUNTER — Ambulatory Visit (INDEPENDENT_AMBULATORY_CARE_PROVIDER_SITE_OTHER): Payer: 59 | Admitting: Family Medicine

## 2014-07-10 ENCOUNTER — Encounter: Payer: Self-pay | Admitting: Family Medicine

## 2014-07-10 VITALS — BP 122/78 | HR 92 | Temp 98.5°F | Wt 248.8 lb

## 2014-07-10 DIAGNOSIS — R0602 Shortness of breath: Secondary | ICD-10-CM

## 2014-07-10 DIAGNOSIS — R609 Edema, unspecified: Secondary | ICD-10-CM

## 2014-07-10 MED ORDER — FUROSEMIDE 20 MG PO TABS: 20.0000 mg | ORAL_TABLET | Freq: Every day | ORAL | Status: DC | PRN

## 2014-07-10 NOTE — Progress Notes (Signed)
Pre visit review using our clinic review tool, if applicable. No additional management support is needed unless otherwise documented below in the visit note.  GI sx improved, but cough didn't fully resolve. Now with more cough, more wheeze, coughing with a deep breath, to the point of post tussive emesis. Raspy wheeze noted on exhalation.  Waking up from the cough.  No fevers. No sputum.  Some SOB.  SABA doesn't help the wheeze but it doesn't help the SOB much.   More BLE edema recently, last week, though some better today.  He had gone back to [place] to see family recently.  Elevated legs, edema improved in the last few days.    Meds, vitals, and allergies reviewed.   ROS: See HPI.  Otherwise, noncontributory.  nad ncat Nasal and OP exam wnl Neck supple, no LA rrr Slightly dec BS at the base B with scant rhonchi but not UAN abd soft Ext with 1+ BLE, normal distal pulses, DP pulses No cyanosis.

## 2014-07-10 NOTE — Patient Instructions (Addendum)
We'll contact you with your lab report.  Take the lasix tomorrow morning and then update me tomorrow afternoon.  We'll go from there.   Take care.

## 2014-07-11 DIAGNOSIS — R609 Edema, unspecified: Secondary | ICD-10-CM | POA: Insufficient documentation

## 2014-07-11 LAB — COMPREHENSIVE METABOLIC PANEL
ALBUMIN: 3.6 g/dL (ref 3.5–5.2)
ALK PHOS: 94 U/L (ref 39–117)
ALT: 25 U/L (ref 0–53)
AST: 21 U/L (ref 0–37)
BUN: 12 mg/dL (ref 6–23)
CALCIUM: 8.7 mg/dL (ref 8.4–10.5)
CHLORIDE: 104 meq/L (ref 96–112)
CO2: 30 mEq/L (ref 19–32)
Creatinine, Ser: 0.9 mg/dL (ref 0.4–1.5)
GFR: 91.98 mL/min (ref >60.00)
GLUCOSE: 85 mg/dL (ref 70–99)
Potassium: 3.3 mEq/L — ABNORMAL LOW (ref 3.5–5.1)
Sodium: 142 mEq/L (ref 135–145)
Total Bilirubin: 0.4 mg/dL (ref 0.2–1.2)
Total Protein: 6.7 g/dL (ref 6.0–8.3)

## 2014-07-11 LAB — BRAIN NATRIURETIC PEPTIDE: Pro B Natriuretic peptide (BNP): 48 pg/mL (ref 0.0–100.0)

## 2014-07-11 LAB — CBC WITH DIFFERENTIAL/PLATELET
BASOS PCT: 0.4 % (ref 0.0–3.0)
Basophils Absolute: 0 10*3/uL (ref 0.0–0.1)
EOS PCT: 5 % (ref 0.0–5.0)
Eosinophils Absolute: 0.5 10*3/uL (ref 0.0–0.7)
HCT: 46.8 % (ref 39.0–52.0)
Hemoglobin: 15.7 g/dL (ref 13.0–17.0)
LYMPHS PCT: 19.4 % (ref 12.0–46.0)
Lymphs Abs: 2.1 10*3/uL (ref 0.7–4.0)
MCHC: 33.5 g/dL (ref 30.0–36.0)
MCV: 93.8 fl (ref 78.0–100.0)
Monocytes Absolute: 0.7 10*3/uL (ref 0.1–1.0)
Monocytes Relative: 6.9 % (ref 3.0–12.0)
NEUTROS PCT: 68.3 % (ref 43.0–77.0)
Neutro Abs: 7.2 10*3/uL (ref 1.4–7.7)
Platelets: 327 10*3/uL (ref 150.0–400.0)
RBC: 4.99 Mil/uL (ref 4.22–5.81)
RDW: 12.3 % (ref 11.5–15.5)
WBC: 10.6 10*3/uL — AB (ref 4.0–10.5)

## 2014-07-11 NOTE — Assessment & Plan Note (Signed)
New, with a cough.  Had been travelling, likely eating more salt.  cxr unremarkable.  Would use 1 dose of lasix in the AM and then update me tomorrow.  Check lytes and CBC, BNP in meantime.  We may need to add on abx depending on labs and condition after lasix.  D/w pt.  He agrees.  Okay for outpatient f/u.  >25 minutes spent in face to face time with patient, >50% spent in counselling or coordination of care.

## 2014-07-15 ENCOUNTER — Telehealth: Payer: Self-pay | Admitting: *Deleted

## 2014-07-15 NOTE — Telephone Encounter (Signed)
I wouldn't do the steroids at this point, since it could make the fluid concerns worse.  I would try to give it more time as is and see if he doesn't continue to improve.  Low salt diet in the meantime- I think he had more salt in his diet when he was on the road.  Thanks.

## 2014-07-15 NOTE — Telephone Encounter (Signed)
Patient advised.

## 2014-07-15 NOTE — Telephone Encounter (Signed)
[name] says you asked him to call and report his condition after the weekend.  He says he is still hoarse, has a little wheezing and is still coughing some at night.  He is better but still hasn't kicked all the symptoms.  He is thinking that maybe you could put him on a course of steroids to see if that will clear it all out but is certainly willing to follow your advice whatever that is.  Please advise.

## 2014-08-01 ENCOUNTER — Telehealth: Payer: Self-pay | Admitting: Family Medicine

## 2014-08-01 DIAGNOSIS — L989 Disorder of the skin and subcutaneous tissue, unspecified: Secondary | ICD-10-CM

## 2014-08-01 NOTE — Telephone Encounter (Signed)
Derm.  Let me know if he needs a referral. Thanks.

## 2014-08-01 NOTE — Telephone Encounter (Signed)
Patient Information:  Caller Name: [place]  Phone: [phone]  Patient: [name], [name]  Gender: Male  DOB: [birth year]  Age: 63 Years  PCP: Elsie Stain Brigitte [facility]) [facility])  Office Follow Up:  Does the office need to follow up with this patient?: Yes  Instructions For The Office: see notes  RN Note:  Pt would like to have this mole removed and wants to know if Dr. Damita Dunnings does this or if he needs to see a Dermatologist. Please call pt back today.  Symptoms  Reason For Call & Symptoms: States he has had a mole on side/upper nose near corner of eye for at least 10 yrs but recently feels that it has grown larger and become firmer/hard/rough to the touch. It is now very bothersome because his glasses constantly get 'hung-up' on it. No fever or any other sxs. Denies any pain or bleeding at the site or any signs of infection.  Reviewed Health History In EMR: Yes  Reviewed Medications In EMR: Yes  Reviewed Allergies In EMR: Yes  Reviewed Surgeries / Procedures: Yes  Date of Onset of Symptoms: Unknown  Guideline(s) Used:  Skin Lesion - Moles or Growths  Disposition Per Guideline:   See Within 3 Days in Office  Reason For Disposition Reached:   Patient wants to be seen  Advice Given:  N/A  Patient Will Follow Care Advice:  YES

## 2014-08-01 NOTE — Telephone Encounter (Signed)
Patient advised.   Patient says he probably needs a referral.  He says it is rapidly changing and he tried to get an appt with a derm that his wife sees but they couldn't see him until December and because of the rapid changes, he doesn't feel comfortable waiting that long.  Doesn't matter if it is [employer] or [employer].

## 2014-08-01 NOTE — Telephone Encounter (Signed)
Ordered

## 2014-08-20 ENCOUNTER — Telehealth: Payer: Self-pay | Admitting: Family Medicine

## 2014-08-20 NOTE — Telephone Encounter (Signed)
Pt calling and wants to get the flu and shingles vaccination. Would like to get the on a same day nurses visit. Or do you require 30 days apart? Please advise. Thank you

## 2014-08-20 NOTE — Telephone Encounter (Signed)
Pt scheduled nurse visit 08/30/14; pt will ck with ins co for shingle vaccine coverage. Signed immunization form is at [employer] in pending immunization folder.

## 2014-08-20 NOTE — Telephone Encounter (Signed)
Okay to do on the same day.  Form done.  Thanks.

## 2014-08-30 ENCOUNTER — Ambulatory Visit: Payer: 59

## 2014-09-06 ENCOUNTER — Encounter: Payer: Self-pay | Admitting: Gastroenterology

## 2014-09-12 ENCOUNTER — Ambulatory Visit (INDEPENDENT_AMBULATORY_CARE_PROVIDER_SITE_OTHER): Payer: 59

## 2014-09-12 ENCOUNTER — Telehealth: Payer: Self-pay | Admitting: Family Medicine

## 2014-09-12 DIAGNOSIS — Z23 Encounter for immunization: Secondary | ICD-10-CM

## 2014-09-12 NOTE — Telephone Encounter (Signed)
Pt dropped of e health screening form to be filled out. Please call when ready for pickup. Thanks [employer]

## 2014-09-12 NOTE — Telephone Encounter (Signed)
Spoke to pts wife and informed her that he will need to bring another form to have completed. They completed Section C, which is designated for physician completion.

## 2014-09-17 ENCOUNTER — Telehealth: Payer: Self-pay | Admitting: Family Medicine

## 2014-09-17 NOTE — Telephone Encounter (Signed)
Form is on your desk.

## 2014-09-17 NOTE — Telephone Encounter (Signed)
Pt dropped off form for insurance that needs to be filled out. Will put inbox

## 2014-09-18 NOTE — Telephone Encounter (Signed)
Form done, please send.  Thanks.  

## 2014-09-19 NOTE — Telephone Encounter (Signed)
Left message on voicemail.

## 2014-09-23 NOTE — Telephone Encounter (Signed)
Completed form faxed per patient's request to [place]

## 2015-01-21 ENCOUNTER — Ambulatory Visit: Payer: Self-pay | Admitting: Family Medicine

## 2015-01-21 ENCOUNTER — Ambulatory Visit (INDEPENDENT_AMBULATORY_CARE_PROVIDER_SITE_OTHER): Payer: 59 | Admitting: Family Medicine

## 2015-01-21 ENCOUNTER — Encounter: Payer: Self-pay | Admitting: Family Medicine

## 2015-01-21 VITALS — BP 142/72 | HR 79 | Temp 98.4°F | Wt 259.8 lb

## 2015-01-21 DIAGNOSIS — R42 Dizziness and giddiness: Secondary | ICD-10-CM

## 2015-01-21 NOTE — Patient Instructions (Signed)
[name] will call about your referral. Don't change your meds for now.  Take care.

## 2015-01-21 NOTE — Progress Notes (Signed)
Pre visit review using our clinic review tool, if applicable. No additional management support is needed unless otherwise documented below in the visit note.  H/o "sinus issues" in 10/2015, that got some better.  Since then, still with the feeling of needing to pop his ears.  3 weeks ago was walking, turned, and then got very dizzy.  Didn't pass out.  Felt more presyncopal than vertigo.  He has had vertigo, but this wasn't exactly the same as pre prev episodes years ago.  The room didn't spin but he still has a feeling of motion, even when still.  He had to sit down and then felt better.  He didn't know if he had gotten up too quickly.  He has had two more episodes in the meantime, felt faint, with sensation of momvement, but again didn't pass out.  One episode was when he was already up and moving, another was when sitting.  None when laying down.    No CP, not SOB, no BLE edema.  The episodes go away quickly but he can still feel slightly lightheaded (though not as severe) between episodes.  No heart racing.  He can note the sx when rolling over in bed or getting out of bed, if he does it too quickly.  That is a recent change over the last few weeks.    He had an unremarkable stress test last year except for a brief episode of SVT during the test.  He has no palpitations o/w    Meds, vitals, and allergies reviewed.   ROS: See HPI.  Otherwise, noncontributory.  GEN: nad, alert and oriented HEENT: mucous membranes moist NECK: supple w/o LA CV: rrr. PULM: ctab, no inc wob ABD: soft, +bs EXT: no edema SKIN: no acute rash CN 2-12 wnl B, S/S/DTR wnl x4, DHP neg

## 2015-01-22 DIAGNOSIS — R42 Dizziness and giddiness: Secondary | ICD-10-CM | POA: Insufficient documentation

## 2015-01-22 NOTE — Assessment & Plan Note (Signed)
He has an unremarkable EKG today.  We talked about his hx- this isn't typical vertigo sx, but he has sensation of movement during the episodes.  I don't think he is having cardiac sx, since he hasn't passed out, has not CP, no palpitations, and one episode was while sitting.  He has no tinnitus.  It would be reasonable to have him see ENT again, for their input.  He agrees.  Continue as is with current meds for now.

## 2015-04-07 ENCOUNTER — Ambulatory Visit (INDEPENDENT_AMBULATORY_CARE_PROVIDER_SITE_OTHER): Payer: 59 | Admitting: Family Medicine

## 2015-04-07 ENCOUNTER — Encounter: Payer: Self-pay | Admitting: Family Medicine

## 2015-04-07 VITALS — BP 140/82 | HR 80 | Temp 98.4°F | Wt 255.2 lb

## 2015-04-07 DIAGNOSIS — H04551 Acquired stenosis of right nasolacrimal duct: Secondary | ICD-10-CM

## 2015-04-07 MED ORDER — AMOXICILLIN-POT CLAVULANATE 875-125 MG PO TABS: 1.0000 | ORAL_TABLET | Freq: Two times a day (BID) | ORAL | Status: DC

## 2015-04-07 MED ORDER — KETOTIFEN FUMARATE 0.025 % OP SOLN
1.0000 [drp] | Freq: Two times a day (BID) | OPHTHALMIC | Status: DC | PRN
Start: 2015-04-07 — End: 2016-02-02

## 2015-04-07 NOTE — Patient Instructions (Signed)
Call your eye doctor about getting seen.   If you can't get seen today, then start the antibiotics and the eye drops.  I want your to get seen either today or tomorrow.   Take care.  Glad to see you.

## 2015-04-07 NOTE — Progress Notes (Signed)
Pre visit review using our clinic review tool, if applicable. No additional management support is needed unless otherwise documented below in the visit note.  He had some soreness medial to the R eye last week.  No trauma.  It got worse at the day went on.  He had medial/inferior swelling.  Then had clear weeping and burning and itching.  He used OTC meds w/o much relief at all. He used compresses.  The sore area medial to the R eye has been fluctuating in size, up and down over the last few days.   No L sided sx.  No vision changes. No pain in the eye itself.    Meds, vitals, and allergies reviewed.   ROS: See HPI.  Otherwise, noncontributory.  nad ncat except for local swelling inferior and medial to R eye.   Orbit not ttp B EOMI, PERRL, conjunctiva wnl Small puffy and tender area medial and inferior to R eye, at the location of the tear duct.  Tm wnl B Nasal exam a little stuffy but o/w wnl OP wnl

## 2015-04-08 DIAGNOSIS — IMO0002 Reserved for concepts with insufficient information to code with codable children: Secondary | ICD-10-CM | POA: Insufficient documentation

## 2015-04-08 NOTE — Assessment & Plan Note (Signed)
Looks to be blocked tear duct, either from mechanical sx or from infection/inflammation.  D/w pt.   Nontoxic.  No eye pathology, no true orbit pathology, ie no sign of orbital cellulitis.   Would have patient see eye clinic.  If he can't be seen today, then start augmentin and use zaditor in meantime.   Still okay for outpatient f/u.  He agrees.

## 2015-04-23 ENCOUNTER — Telehealth: Payer: Self-pay

## 2015-04-23 MED ORDER — AMOXICILLIN-POT CLAVULANATE 875-125 MG PO TABS: 1.0000 | ORAL_TABLET | Freq: Two times a day (BID) | ORAL | Status: DC

## 2015-04-23 NOTE — Telephone Encounter (Signed)
Rx sent.  Please notify pt.   Have him call the eye clinic about follow up when back in town. Thanks.

## 2015-04-23 NOTE — Telephone Encounter (Signed)
Patient notified as instructed by telephone and verbalized understanding. Patient stated that he is on the road now and will contact [place] [facility] and have the script  transferred [facility] [place].

## 2015-04-23 NOTE — Telephone Encounter (Signed)
Mrs Cisse said pt was seen on 04/07/15 for eye infection; infection cleared after medication.  Rt eye has restarted with some swelling, weeping and itching. Pt saw Dr Ellin Mayhew, the eye dr on 04/07/15 and he agreed with Dr Josefine Class dx and course of treatment. If pt continues with eye infections pt is to call Dr Ellin Mayhew about possible eye surgery on tear duct. Pt is going out of town this morning for a 12 hour drive and pt has eye drops but request [facility]x sent [facility]. Mrs Hedglin request cb when med sent so pt can leave town. Will send note to Dr Damita Dunnings but pts wife request sent to doctor in office to get med sent so pt can get med before leaves town.Please advise.

## 2015-07-22 ENCOUNTER — Telehealth: Payer: Self-pay | Admitting: Family Medicine

## 2015-07-22 NOTE — Telephone Encounter (Signed)
Patient has appointment for physical on 08/18/15.  Patient's wife works for [employer].  Patient needs an order to take to [employer].  Please mail order to patient.

## 2015-07-24 NOTE — Telephone Encounter (Signed)
Orders done for cmet/lipid.  I didn't put order for PSA since he doesn't have a FH of prostate cancer.  It wouldn't need to be done as a routine screening.  If he wants PSA done anyway, then let me know and I'll add it to the slip before you mail it out.  Thanks.

## 2015-07-24 NOTE — Telephone Encounter (Signed)
Spoke with patient and advised results order mailed to patients home address

## 2015-08-06 ENCOUNTER — Encounter: Payer: Self-pay | Admitting: Family Medicine

## 2015-08-06 LAB — LIPID PANEL
CHOLESTEROL: 173 mg/dL (ref 0–200)
HDL: 38 mg/dL (ref 35–70)
LDL Cholesterol: 94 mg/dL
TRIGLYCERIDES: 207 mg/dL — AB (ref 40–160)

## 2015-08-06 LAB — BASIC METABOLIC PANEL
Creatinine: 1.1 mg/dL (ref <=1.3)
Glucose: 111 mg/dL

## 2015-08-06 LAB — HEPATIC FUNCTION PANEL
ALT: 35 U/L (ref 10–40)
AST: 17 U/L (ref 14–40)

## 2015-08-18 ENCOUNTER — Encounter: Payer: Self-pay | Admitting: Family Medicine

## 2015-08-18 ENCOUNTER — Ambulatory Visit (INDEPENDENT_AMBULATORY_CARE_PROVIDER_SITE_OTHER): Payer: 59 | Admitting: Family Medicine

## 2015-08-18 VITALS — BP 118/72 | HR 78 | Temp 98.3°F | Ht 73.0 in | Wt 252.8 lb

## 2015-08-18 DIAGNOSIS — G4730 Sleep apnea, unspecified: Secondary | ICD-10-CM | POA: Diagnosis not present

## 2015-08-18 DIAGNOSIS — R739 Hyperglycemia, unspecified: Secondary | ICD-10-CM

## 2015-08-18 DIAGNOSIS — Z0000 Encounter for general adult medical examination without abnormal findings: Secondary | ICD-10-CM | POA: Diagnosis not present

## 2015-08-18 DIAGNOSIS — Z7189 Other specified counseling: Secondary | ICD-10-CM

## 2015-08-18 DIAGNOSIS — Z23 Encounter for immunization: Secondary | ICD-10-CM | POA: Diagnosis not present

## 2015-08-18 NOTE — Progress Notes (Signed)
Pre visit review using our clinic review tool, if applicable. No additional management support is needed unless otherwise documented below in the visit note.  CPE- See plan.  Routine anticipatory guidance given to patient.  See health maintenance. Tetanus 2014 Flu shot 2016 Shingles shot 2015 PNA shot not due yet.  Colonoscopy 2015 Prostate cancer screening and PSA options (with potential risks and benefits of testing vs not testing) were discussed along with recent recs/guidelines.  He declined testing PSA at this point.  Living will d/w pt. He has one. Would have his wife designated if incapacitated.  Diet and exercise d/w pt. Plantar fasciitis had limited activity prev. He has been injected by podiatry. He is stretching and icing his foot. He has been able to walk some in the meantime.   He needs work on diet.  D/w pt.   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  Wife notes apnea.  He has fatigue.   Snoring.  D/w pt about path/phys.   Sugar elevation noted.  TG up.  Labs d/w pt along with diet and exercise.    PMH and SH reviewed  Meds, vitals, and allergies reviewed.   ROS: See HPI.  Otherwise negative.    GEN: nad, alert and oriented HEENT: mucous membranes moist NECK: supple w/o LA CV: rrr. PULM: ctab, no inc wob ABD: soft, +bs EXT: no edema SKIN: no acute rash

## 2015-08-18 NOTE — Patient Instructions (Addendum)
[name] will call about your referral. Recheck fasting sugar in about 6 months.   Look at [email or web address] and [email or web address].  Total your calories and then cut 5%.  Take care.  Glad to see you.

## 2015-08-19 DIAGNOSIS — Z7189 Other specified counseling: Secondary | ICD-10-CM | POA: Insufficient documentation

## 2015-08-19 DIAGNOSIS — G4730 Sleep apnea, unspecified: Secondary | ICD-10-CM | POA: Insufficient documentation

## 2015-08-19 NOTE — Assessment & Plan Note (Signed)
Sugar elevation noted. TG up. Labs d/w pt along with diet and exercise.  Needs recheck in about 6 months.  He agrees.

## 2015-08-19 NOTE — Assessment & Plan Note (Signed)
Likely, path/phys d/w pt.  Needs weight loss.  Refer to pulmonary.  He agrees.  No high risk sx.

## 2015-08-19 NOTE — Assessment & Plan Note (Signed)
Routine anticipatory guidance given to patient.  See health maintenance. Tetanus 2014 Flu shot 2016 Shingles shot 2015 PNA shot not due yet.  Colonoscopy 2015 Prostate cancer screening and PSA options (with potential risks and benefits of testing vs not testing) were discussed along with recent recs/guidelines.  He declined testing PSA at this point.  Living will d/w pt. He has one. Would have his wife designated if incapacitated.  Diet and exercise d/w pt. Plantar fasciitis had limited activity prev. He has been injected by podiatry. He is stretching and icing his foot. He has been able to walk some in the meantime.   He needs work on diet.  D/w pt.

## 2015-09-01 ENCOUNTER — Telehealth: Payer: Self-pay | Admitting: Family Medicine

## 2015-09-01 DIAGNOSIS — H5789 Other specified disorders of eye and adnexa: Secondary | ICD-10-CM

## 2015-09-01 NOTE — Telephone Encounter (Signed)
Patient called requesting an Opthamology consult, his right eye is burning, itching and tender. He saw you for this last May and you told him to call if he wanted a referral. Patient going away 10/6thru the 9th and again 09/10/15.

## 2015-09-01 NOTE — Telephone Encounter (Signed)
Patient notified by telephone that order has been done. Patient stated that he is there now.

## 2015-09-01 NOTE — Telephone Encounter (Signed)
Ordered. Thanks

## 2015-09-18 ENCOUNTER — Encounter: Payer: Self-pay | Admitting: Internal Medicine

## 2015-09-18 ENCOUNTER — Ambulatory Visit (INDEPENDENT_AMBULATORY_CARE_PROVIDER_SITE_OTHER): Payer: 59 | Admitting: Internal Medicine

## 2015-09-18 VITALS — BP 120/70 | HR 73 | Ht 73.0 in | Wt 252.0 lb

## 2015-09-18 DIAGNOSIS — G4730 Sleep apnea, unspecified: Secondary | ICD-10-CM

## 2015-09-18 NOTE — Patient Instructions (Signed)
Home sleep study, if positive will go for sleep titration study.

## 2015-09-18 NOTE — Progress Notes (Signed)
[facility] Consultation      Assessment and Plan:  Excessive daytime sleepiness, with symptoms and signs suggestive of obstructive sleep apnea. -Will send for a sleep study, if positive, will send the patient for a CPAP titration study. We'll have the patient follow-up 1-3 months after the titration study.  Allergic rhinitis. -Appears to be controlled with a nasal steroid and fexofenadine.  GERD -Controlled on omeprazole.  Date: 09/18/2015  MRN# [id number] [name] 1951-01-12  Referring Physician:   LENVILLE HIBBERD is a 64 y.o. old male seen in consultation for chief complaint of:    Chief Complaint  Patient presents with  . SLEEP CONSULT    pt. ref by dr. Elsie Stain. pt. states his wife says he stops breathing during the night and loud snoring.  epworth score:6    HPI:  The patient is a 64 year old male referred for symptoms of excessive daytime sleepiness. The patient notes that his wife has informed him that he has gasping breathing at night and he often stops breathing in his sleep. He mentioned this to his primary care doctor recently and was referred here for an evaluation for sleep apnea. He is currently retired, he notes that he is somewhat sleepy during the day. He goes to bed around 9:30, wakes up about twice per night to urinate, and usually wakes up and is out of bed around 6:30 to 7:00. He notes that he is not wake up feeling refreshed and is often tired despite a full night sleep. He does not nap during the day, but often notes that he will catch himself dozing off if he is on the computer or watching television. He has family who lives in [place] and sometimes drives down there, he notes on these occasions that he will be sleepy and will have to pull over to take a nap. He has no other particular complaints at this time. He does have a history of rhinitis, he takes a nasal steroid, which seems to control it fairly well.  No flowsheet  data found.  Pulmonary Functions Testing Results:  No results found for: FEV1, FVC, FEV1FVC, TLC, DLCO   PMHX:   Past Medical History  Diagnosis Date  . Allergic rhinitis 04/2006  . 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 GERD (gastroesophageal reflux disease)   . Plantar fasciitis    Surgical Hx:  Past Surgical History  Procedure Laterality Date  . Cholecystectomy  1999  . Knee arthroscopy      left, multiple- 5 times, last 2006, 1 time on the R   Family Hx:  Family History  Problem Relation Age of Onset  . Hypertension Mother   . Stroke Mother     multiple strokes  . Cancer Maternal Grandmother     uterine  . Diabetes Neg Hx   . Depression Neg Hx   . Colon cancer Neg Hx   . Prostate cancer Neg Hx   . Pancreatic cancer Neg Hx   . Rectal cancer Neg Hx   . Stomach cancer Neg Hx    Social Hx:   Social History  Substance Use Topics  . Smoking status: Former Smoker -- 1.00 packs/day for 30 years    Types: Cigarettes    Quit date: 11/29/2002  . Smokeless tobacco: Never Used  . Alcohol Use: 0.6 oz/week    1 Standard drinks or equivalent per week     Comment:  rarely   Medication:   Current Outpatient Rx  Name  Route  Sig  Dispense  Refill  . aspirin 81 MG tablet   Oral   Take 81 mg by mouth daily.           . fexofenadine (ALLEGRA) 180 MG tablet   Oral   Take 180 mg by mouth daily.           Marland Kitchen ketotifen (ZADITOR) 0.025 % ophthalmic solution   Right Eye   Place 1 drop into the right eye 2 (two) times daily as needed (for itching).   5 mL   0   . Multiple Vitamin (MULTIVITAMIN) tablet   Oral   Take 1 tablet by mouth daily.           Marland Kitchen omeprazole (PRILOSEC) 20 MG capsule   Oral   Take 1 capsule (20 mg total) by mouth daily.         Marland Kitchen triamcinolone (NASACORT) 55 MCG/ACT nasal inhaler      PLACE 1 SPRAY INTO THE NOSE DAILY.   16.5 g   1       Allergies:  Review of patient's  allergies indicates no known allergies.  Review of Systems: Gen:  Denies  fever, sweats, chills HEENT: Denies blurred vision, double vision. bleeds, sore throat Cvc:  No dizziness, chest pain. Resp:   Denies cough or sputum porduction, shortness of breath Gi: Denies swallowing difficulty, stomach pain. Gu:  Denies bladder incontinence, burning urine Ext:   No Joint pain, stiffness. Skin: No skin rash,  hives Endoc:  No polyuria, polydipsia. Psych: No depression, insomnia. Other:  All other systems were reviewed with the patient and were negative other that what is mentioned in the HPI.   Physical Examination:   VS: BP 120/70 mmHg  Pulse 73  Ht 6\' 1"  (1.854 m)  Wt 252 lb (114.306 kg)  BMI 33.25 kg/m2  SpO2 98%  General Appearance: No distress  Neuro:without focal findings,  speech normal,  HEENT: PERRLA, EOM intact. Mallampati 3  Pulmonary: normal breath sounds, No wheezing.  CardiovascularNormal S1,S2.  No m/r/g.   Abdomen: Benign, Soft, non-tender. Renal:  No costovertebral tenderness  GU:  No performed at this time. Endoc: No evident thyromegaly, no signs of acromegaly. Skin:   warm, no rashes, no ecchymosis  Extremities: normal, no cyanosis, clubbing.  Other findings:    LABORATORY PANEL:   CBC No results for input(s): WBC, HGB, HCT, PLT in the last 168 hours. ------------------------------------------------------------------------------------------------------------------  Chemistries  No results for input(s): NA, K, CL, CO2, GLUCOSE, BUN, CREATININE, CALCIUM, MG, AST, ALT, ALKPHOS, BILITOT in the last 168 hours.  Invalid input(s): GFRCGP ------------------------------------------------------------------------------------------------------------------  Cardiac Enzymes No results for input(s): TROPONINI in the last 168 hours. ------------------------------------------------------------  RADIOLOGY:  No results found.     Thank  you for the consultation  and for allowing [facility] to assist in the care of your patient. Our recommendations are noted above.  Please contact us if we can be of further service.   Marda Stalker, MD.  Board Certified in Internal Medicine, Pulmonary Medicine, [place], and Sleep Medicine.  [facility] Office Number: [phone]  Patricia Pesa, M.D.  Vilinda Boehringer, M.D.  Merton Border, M.D

## 2015-11-30 IMAGING — CR DG CHEST 2V
2 series · 2 of 2 positions shown · non-contrast
Comparison: Chest x-ray of 02/25/2014

CLINICAL DATA: Cough, shortness of breath, former smoking history

EXAM:
CHEST  2 VIEW

[view not recorded (1 of 2)]
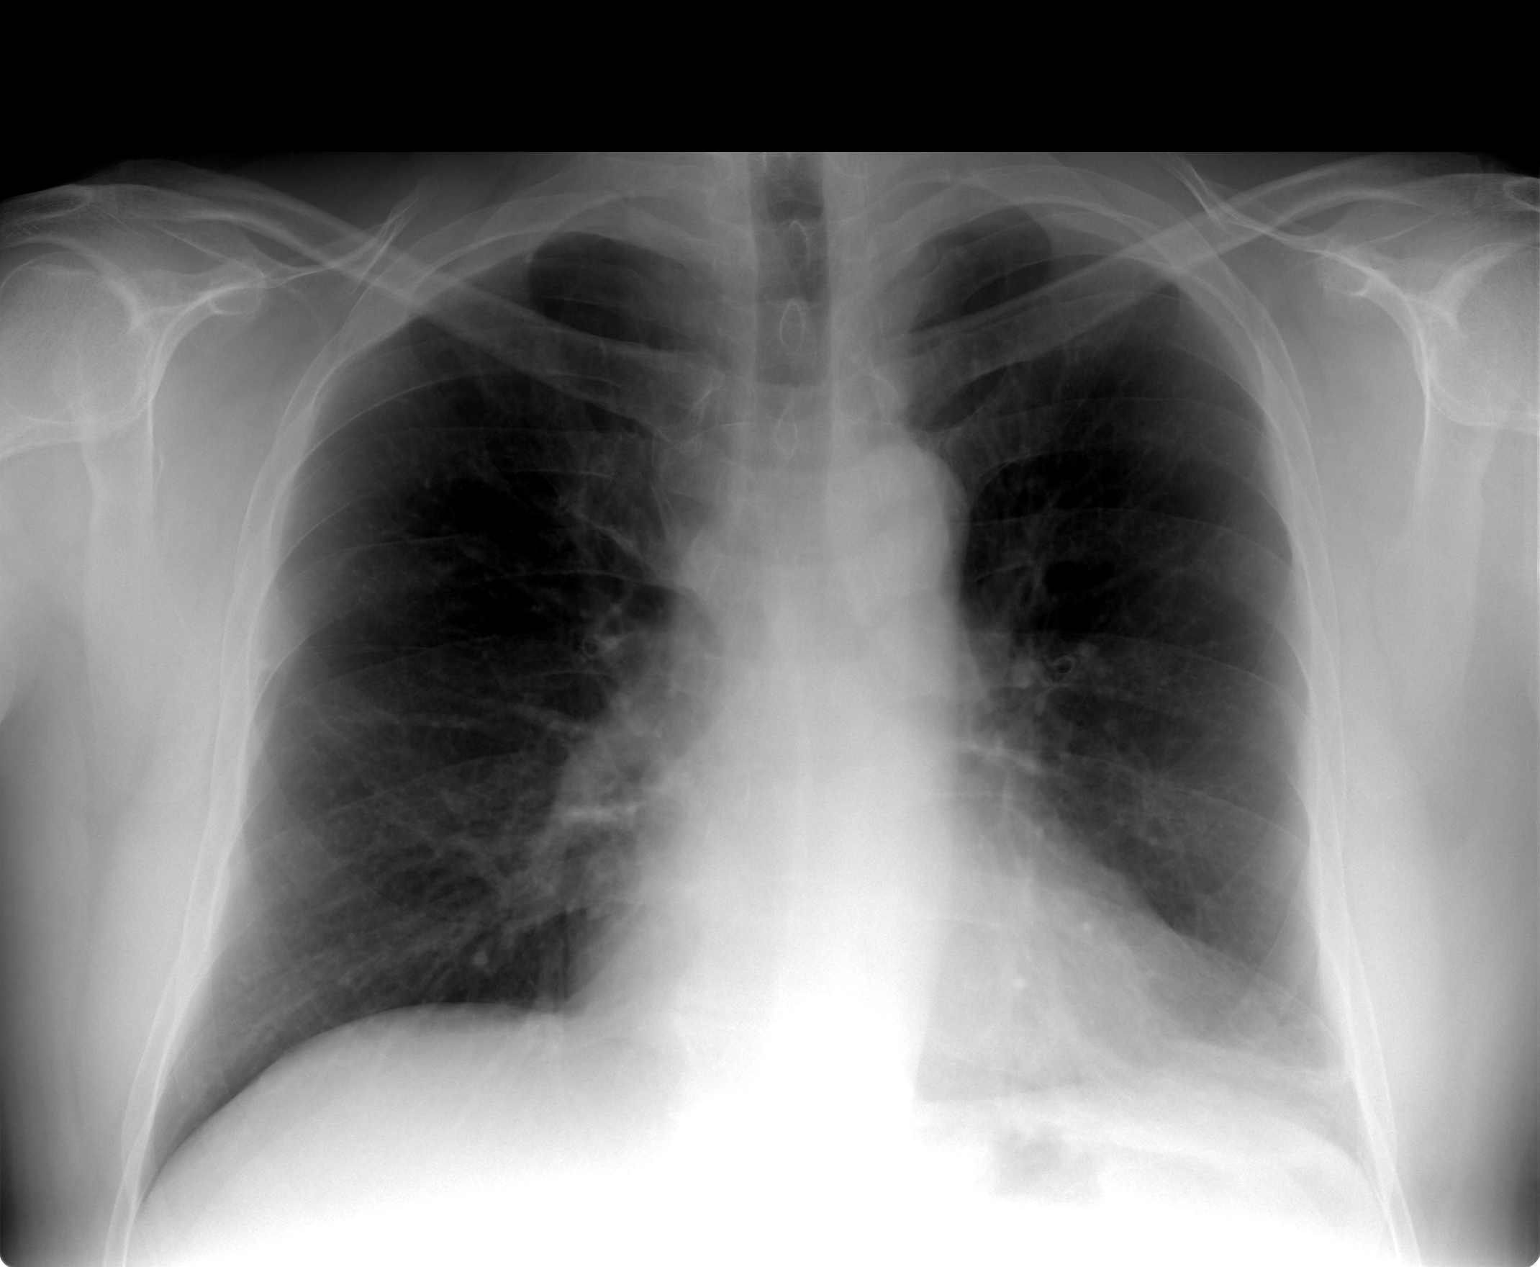

[view not recorded (2 of 2)]
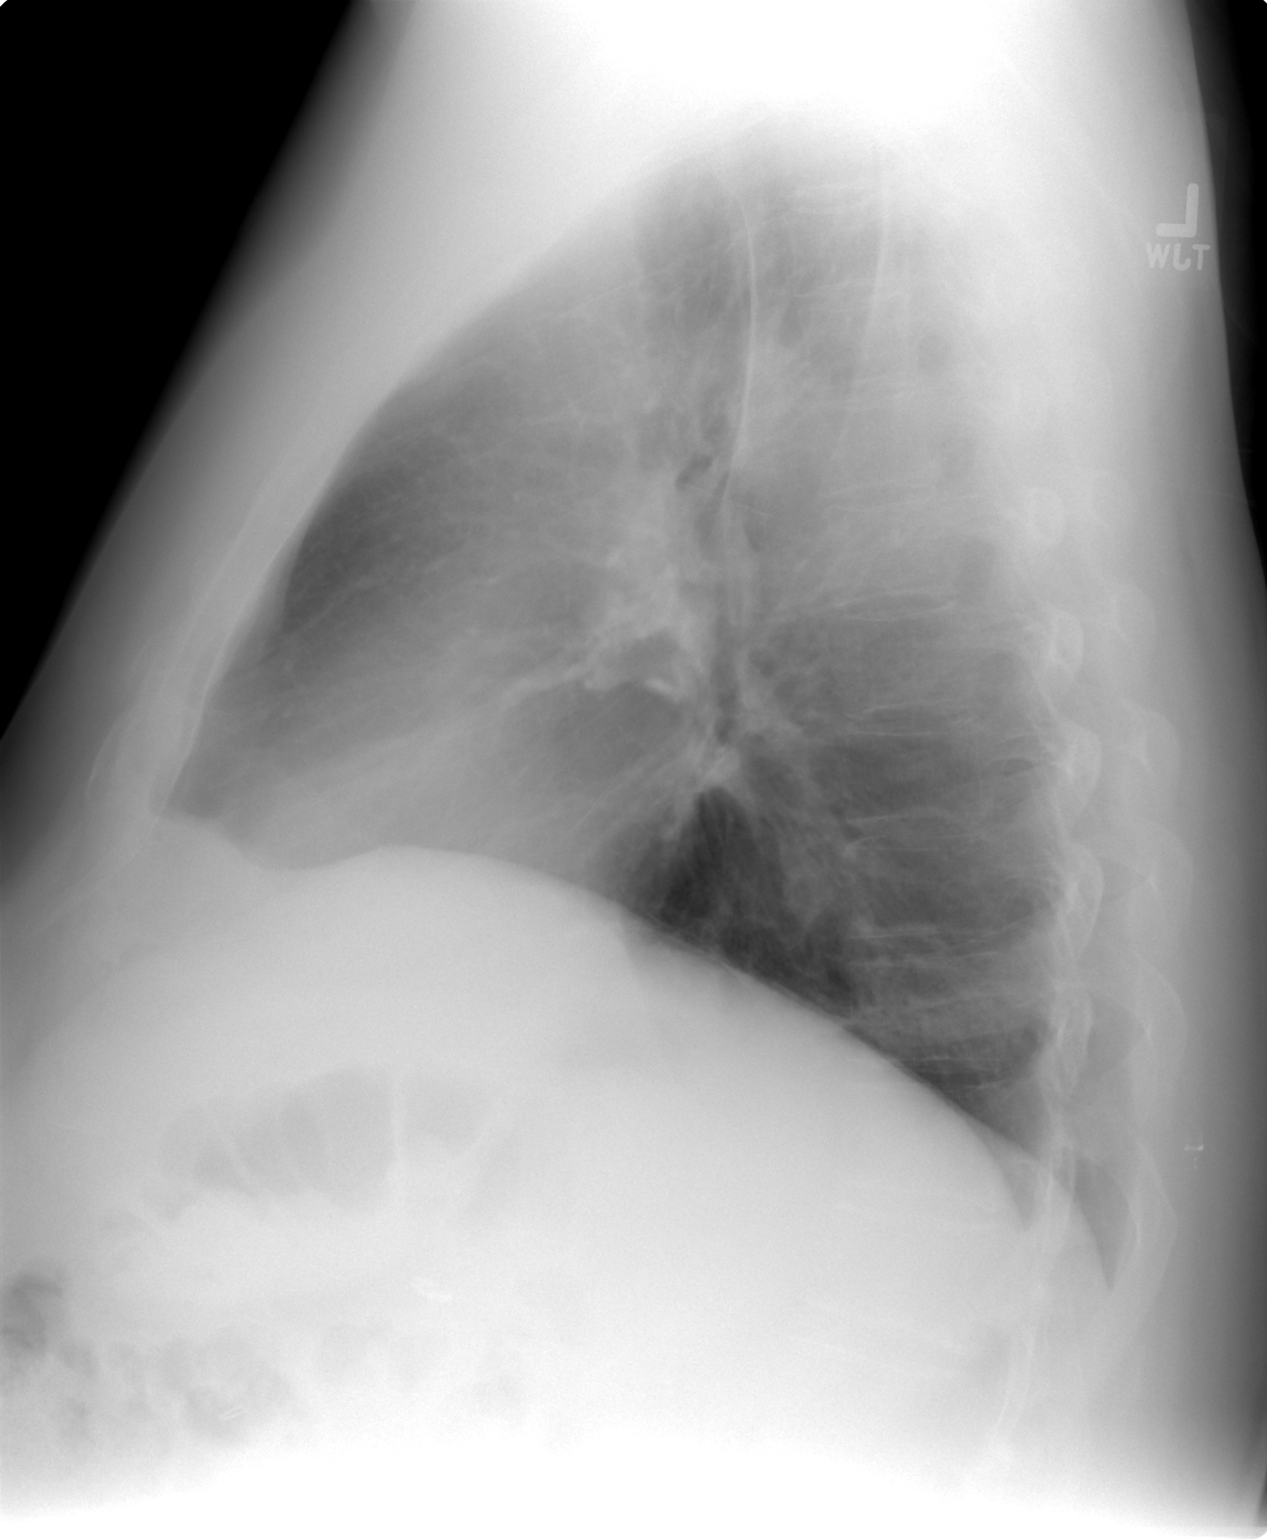

[2 of 2 positions shown; findings below may reference images not displayed]

FINDINGS: No active infiltrate or effusion is seen. Mediastinal contours are
unremarkable. The heart is within upper limits of normal. No acute
bony abnormality is seen
IMPRESSION: No active cardiopulmonary disease.

## 2015-12-04 DIAGNOSIS — G4733 Obstructive sleep apnea (adult) (pediatric): Secondary | ICD-10-CM | POA: Diagnosis not present

## 2015-12-05 DIAGNOSIS — G4733 Obstructive sleep apnea (adult) (pediatric): Secondary | ICD-10-CM | POA: Diagnosis not present

## 2015-12-09 ENCOUNTER — Encounter: Payer: Self-pay | Admitting: Internal Medicine

## 2015-12-16 ENCOUNTER — Other Ambulatory Visit: Payer: Self-pay | Admitting: *Deleted

## 2015-12-16 DIAGNOSIS — G4730 Sleep apnea, unspecified: Secondary | ICD-10-CM

## 2016-02-02 ENCOUNTER — Encounter: Payer: Self-pay | Admitting: Family Medicine

## 2016-02-02 ENCOUNTER — Telehealth: Payer: Self-pay | Admitting: *Deleted

## 2016-02-02 ENCOUNTER — Ambulatory Visit (INDEPENDENT_AMBULATORY_CARE_PROVIDER_SITE_OTHER): Payer: 59 | Admitting: Family Medicine

## 2016-02-02 ENCOUNTER — Other Ambulatory Visit: Payer: Self-pay

## 2016-02-02 VITALS — BP 142/80 | HR 94 | Temp 99.6°F | Wt 255.5 lb

## 2016-02-02 DIAGNOSIS — G4733 Obstructive sleep apnea (adult) (pediatric): Secondary | ICD-10-CM

## 2016-02-02 DIAGNOSIS — R05 Cough: Secondary | ICD-10-CM | POA: Diagnosis not present

## 2016-02-02 DIAGNOSIS — R6889 Other general symptoms and signs: Secondary | ICD-10-CM

## 2016-02-02 DIAGNOSIS — R059 Cough, unspecified: Secondary | ICD-10-CM

## 2016-02-02 LAB — POC INFLUENZA A&B (BINAX/QUICKVUE)
INFLUENZA A, POC: NEGATIVE
INFLUENZA B, POC: NEGATIVE

## 2016-02-02 MED ORDER — OSELTAMIVIR PHOSPHATE 75 MG PO CAPS: 75.0000 mg | ORAL_CAPSULE | Freq: Two times a day (BID) | ORAL | Status: DC

## 2016-02-02 MED ORDER — HYDROCODONE-HOMATROPINE 5-1.5 MG/5ML PO SYRP: 5.0000 mL | ORAL_SOLUTION | Freq: Three times a day (TID) | ORAL | Status: DC | PRN

## 2016-02-02 NOTE — Progress Notes (Signed)
Pre visit review using our clinic review tool, if applicable. No additional management support is needed unless otherwise documented below in the visit note.  Wife has been sick.  In the meantime he started feeling worse yesterday.  Cough, fatigue.  Took otc cough medicine and tessalon but that didn't help. Coughed most of last night, waking every few minutes.  Nausea this AM, vomited this AM once.  Took a zofran this AM but then vomited that up.  Taking sips of fluids.  Diffuse aches.  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  Finally did get some zofran and fluids down.  Still with some nausea.  No diarrhea.  Fever 101-102 last night.  Taking ibuprofen.    Meds, vitals, and allergies reviewed.   ROS: See HPI.  Otherwise, noncontributory.  GEN: nad, alert and oriented HEENT: mucous membranes moist, tm w/o erythema, nasal exam w/o erythema, clear discharge noted,  OP with cobblestoning NECK: supple w/o LA CV: rrr.   PULM: ctab, no inc wob EXT: no edema SKIN: no acute rash

## 2016-02-02 NOTE — Telephone Encounter (Signed)
Pt informed of HST results. CPAP titration ordered.

## 2016-02-02 NOTE — Patient Instructions (Signed)
Presumed flu.  Start tamiflu.  Use hycodan for cough.  Rest and fluids.  Update [place] as needed.  Take care.  Glad to see you.

## 2016-02-02 NOTE — Assessment & Plan Note (Signed)
Concern for false neg flu test.  D/w pt.  He agrees.  Presumed flu.  Start tamiflu.  Use hycodan for cough.  Rest and fluids.  Update [place] as needed.  Nontoxic.  Okay for outpatient f/u.

## 2016-02-03 ENCOUNTER — Ambulatory Visit: Payer: 59 | Admitting: Internal Medicine

## 2016-02-03 ENCOUNTER — Encounter: Payer: Self-pay | Admitting: Family Medicine

## 2016-02-03 LAB — CHOLESTEROL, TOTAL
CHOLESTEROL, TOTAL: 175
GLUCOSE: 96
HDL: 37 mg/dL (ref 35–70)
LDL CALC: 106 mg/dL
Triglycerides: 161

## 2016-03-01 ENCOUNTER — Telehealth: Payer: Self-pay | Admitting: Internal Medicine

## 2016-03-01 DIAGNOSIS — G4733 Obstructive sleep apnea (adult) (pediatric): Secondary | ICD-10-CM

## 2016-03-01 NOTE — Telephone Encounter (Signed)
Pt returned call and stated voiced understanding over the CPAP Titration Study and was understood that we would send order to DME.  Pt advised that he would be leaving on 03/04/16 and would be returning 03/22/16.  Then he would be leaving town again on 03/25/16.  Pt advised that he would like to use [facility] in [place] for Auto CPAP set up. Pt advised that [facility] would contact him to arrange set up at the [facility].  Pt is aware that in lab study has been cancelled.  Misty Please place DME referral to [facility] in [place] for Auto CPAP. Rhonda J Cobb

## 2016-03-01 NOTE — Telephone Encounter (Signed)
Jacki from Sleep Med called to notify us that pt's insurance denied CPAP Titration Study and recommended patient to be placed on Auto CPAP.  Called and [facility] for pt to return my call explaining the below.  Advised pt on message that CPAP Titration Study has been cancelled.  Pt advised to call me to arrange Auto CPAP. Rhonda J Cobb

## 2016-03-02 NOTE — Telephone Encounter (Signed)
Order placed for auto CPAP 

## 2016-04-21 ENCOUNTER — Encounter: Payer: Self-pay | Admitting: Internal Medicine

## 2016-05-18 ENCOUNTER — Encounter: Payer: Self-pay | Admitting: Internal Medicine

## 2016-05-20 ENCOUNTER — Encounter: Payer: Self-pay | Admitting: Internal Medicine

## 2016-05-20 ENCOUNTER — Ambulatory Visit (INDEPENDENT_AMBULATORY_CARE_PROVIDER_SITE_OTHER): Payer: 59 | Admitting: Internal Medicine

## 2016-05-20 VITALS — BP 140/88 | HR 71 | Ht 73.0 in | Wt 259.0 lb

## 2016-05-20 DIAGNOSIS — G4733 Obstructive sleep apnea (adult) (pediatric): Secondary | ICD-10-CM | POA: Diagnosis not present

## 2016-05-20 NOTE — Progress Notes (Signed)
* [facility]     Assessment and Plan:  Obstructive sleep apnea. -AHI of 21, currently on AutoPap. -We'll change a CPAP pressure range to 10-20  Chronic rhinitis. -Currently using Nasacort and Breathe Right strips. -Recommend a change Nasacort from once every morning. 2. Once every evening before putting on CPAP.  Date: 05/20/2016  MRN# [id number] [name] January 09, 1951   [name] is a 65 y.o. old male seen in follow up for chief complaint of  Chief Complaint  Patient presents with  . Follow-up    CPAP f/u; doing well; still wakes up approx 1 time nightly; getting adjusted more     HPI:   The patient is a 65 yo male with a history of excessive daytime sleepiness, allergic rhinitis, gerd. This study showed moderate OSA with AHI of 21, she was started on auto-PAP.  Review of HST tracings from 12/04/15; AHI was 20.9/hr.  He notes he is feeling much better on CPAP, he is using it every night. He is to wake up multiple times at night, he is currently only waking up once per night, he is feeling more rested and more energetic during the day. He notes he is having nasal stuffiness which sometimes a interrupts use of CPAP. He is currently using Nasacort once daily.  Review of download data and tracings from one month prior to 05/18/16: Auto set between 5 and 20, 95th percentile is 6.2, median is 12.3. AHI is 2.7, the pressure would occasionally ramp up to 20 on multiple nights. Average usage is 6 hours 42 minutes on 93% of days.  Medication:   Outpatient Encounter Prescriptions as of 05/20/2016  Medication Sig  . aspirin 81 MG tablet Take 81 mg by mouth daily.    . fexofenadine (ALLEGRA) 180 MG tablet Take 180 mg by mouth daily.    Marland Kitchen HYDROcodone-homatropine (HYCODAN) 5-1.5 MG/5ML syrup Take 5 mLs by mouth every 8 (eight) hours as needed for cough (sedation caution).  . Multiple Vitamin (MULTIVITAMIN) tablet Take 1 tablet by mouth daily.    Marland Kitchen omeprazole  (PRILOSEC) 20 MG capsule Take 1 capsule (20 mg total) by mouth daily.  Marland Kitchen oseltamivir (TAMIFLU) 75 MG capsule Take 1 capsule (75 mg total) by mouth 2 (two) times daily.  Marland Kitchen triamcinolone (NASACORT) 55 MCG/ACT nasal inhaler PLACE 1 SPRAY INTO THE NOSE DAILY.   No facility-administered encounter medications on file as of 05/20/2016.     Allergies:  Review of patient's allergies indicates no known allergies.  Review of Systems: Gen:  Denies  fever, sweats. HEENT: Denies blurred vision. Cvc:  No dizziness, chest pain or heaviness Resp:   Denies cough or sputum porduction. Gi: Denies swallowing difficulty, stomach pain. constipation,  Gu:  Denies bladder incontinence, burning urine Ext:   No Joint pain, stiffness. Skin: No skin rash, easy bruising. Endoc:  No polyuria, polydipsia. Psych: No depression, insomnia. Other:  All other systems were reviewed and found to be negative other than what is mentioned in the HPI.   Physical Examination:   VS: BP 140/88 mmHg  Pulse 71  Ht 6\' 1"  (1.854 m)  Wt 259 lb (117.482 kg)  BMI 34.18 kg/m2  SpO2 98%  General Appearance: No distress  Neuro:without focal findings,  speech normal,  HEENT: PERRLA, EOM intact. Pulmonary: normal breath sounds, No wheezing.   CardiovascularNormal S1,S2.  No m/r/g.   Abdomen: Benign, Soft, non-tender. Renal:  No costovertebral tenderness  GU:  Not performed at this time. Endoc:  No evident thyromegaly, no signs of acromegaly. Skin:   warm, no rash. Extremities: normal, no cyanosis, clubbing.   LABORATORY PANEL:   CBC No results for input(s): WBC, HGB, HCT, PLT in the last 168 hours. ------------------------------------------------------------------------------------------------------------------  Chemistries  No results for input(s): NA, K, CL, CO2, GLUCOSE, BUN, CREATININE, CALCIUM, MG, AST, ALT, ALKPHOS, BILITOT in the last 168 hours.  Invalid input(s):  GFRCGP ------------------------------------------------------------------------------------------------------------------  Cardiac Enzymes No results for input(s): TROPONINI in the last 168 hours. ------------------------------------------------------------  RADIOLOGY:   No results found for this or any previous visit. Results for orders placed in visit on 07/10/14  DG Chest 2 View   Narrative CLINICAL DATA:  Cough, shortness of breath, former smoking history  EXAM: CHEST  2 VIEW  COMPARISON:  Chest x-ray of 02/25/2014  FINDINGS: No active infiltrate or effusion is seen. Mediastinal contours are unremarkable. The heart is within upper limits of normal. No acute bony abnormality is seen  IMPRESSION: No active cardiopulmonary disease.   Electronically Signed   By: Ivar Drape M.D.   On: 07/10/2014 16:47    ------------------------------------------------------------------------------------------------------------------  Thank  you for allowing [facility] to assist in the care of your patient. Our recommendations are noted above.  Please contact us if we can be of further service.   Marda Stalker, MD.  [facility] Office Number: [phone]  Patricia Pesa, M.D.  Vilinda Boehringer, M.D.  Merton Border, M.D  05/20/2016

## 2016-05-20 NOTE — Patient Instructions (Addendum)
--  Continue using CPAP every night.  --Will adjust auto-pressure to 10-20 (currently at 5-20)

## 2016-09-02 ENCOUNTER — Telehealth: Payer: Self-pay | Admitting: Family Medicine

## 2016-09-02 NOTE — Telephone Encounter (Signed)
Pt dropped off health screening form for work ins. He has signed up for weight loss classes and needs filled out. Last cpe was 07/2015 and currently one sch for 09/2016. I placed in Rx tower. Call when ready for pick up.

## 2016-09-02 NOTE — Telephone Encounter (Signed)
Placed in Dr. Duncan's In Box. 

## 2016-09-03 NOTE — Telephone Encounter (Signed)
I'll work on the hard copy.  Thanks.  

## 2016-09-23 ENCOUNTER — Telehealth: Payer: Self-pay | Admitting: Family Medicine

## 2016-09-23 NOTE — Telephone Encounter (Signed)
Pt needs orders for cpe labs to be drawn at [facility]. He requesting a call when ready to pick up.

## 2016-09-26 NOTE — Telephone Encounter (Signed)
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  Thanks.

## 2016-09-27 NOTE — Telephone Encounter (Signed)
Patient advised. Left at front desk for pickup. 

## 2016-09-28 ENCOUNTER — Other Ambulatory Visit: Payer: Self-pay | Admitting: Family Medicine

## 2016-09-29 ENCOUNTER — Encounter: Payer: Self-pay | Admitting: Family Medicine

## 2016-09-29 LAB — LIPID PANEL W/O CHOL/HDL RATIO
CHOLESTEROL TOTAL: 201 mg/dL — AB (ref 100–199)
HDL: 42 mg/dL (ref >39)
LDL CALC: 107 mg/dL — AB (ref 0–99)
Triglycerides: 260 mg/dL — ABNORMAL HIGH (ref 0–149)
VLDL CHOLESTEROL CAL: 52 mg/dL — AB (ref 5–40)

## 2016-09-29 LAB — COMPREHENSIVE METABOLIC PANEL
ALK PHOS: 98 IU/L (ref 39–117)
ALT: 45 IU/L — ABNORMAL HIGH (ref 0–44)
AST: 24 IU/L (ref 0–40)
Albumin/Globulin Ratio: 1.9 (ref 1.2–2.2)
Albumin: 4.3 g/dL (ref 3.6–4.8)
BUN / CREAT RATIO: 19 (ref 10–24)
BUN: 20 mg/dL (ref 8–27)
Bilirubin Total: 0.5 mg/dL (ref 0.0–1.2)
CO2: 29 mmol/L (ref 18–29)
CREATININE: 1.08 mg/dL (ref 0.76–1.27)
Calcium: 9.4 mg/dL (ref 8.6–10.2)
Chloride: 100 mmol/L (ref 96–106)
GFR, EST AFRICAN AMERICAN: 83 mL/min/{1.73_m2} (ref >59)
GFR, EST NON AFRICAN AMERICAN: 72 mL/min/{1.73_m2} (ref >59)
GLOBULIN, TOTAL: 2.3 g/dL (ref 1.5–4.5)
Glucose: 102 mg/dL — ABNORMAL HIGH (ref 65–99)
Potassium: 4.5 mmol/L (ref 3.5–5.2)
SODIUM: 142 mmol/L (ref 134–144)
TOTAL PROTEIN: 6.6 g/dL (ref 6.0–8.5)

## 2016-09-30 ENCOUNTER — Encounter: Payer: Self-pay | Admitting: Family Medicine

## 2016-09-30 LAB — GLUCOSE (CC13)
ALT: 45
AST: 24 U/L
CHOLESTEROL, TOTAL: 201
CREATININE: 1.08
GLUCOSE: 102
HDL Cholesterol: 42
LDL Cholesterol (Calc): 107
TRIGLYCERIDES: 260

## 2016-10-06 ENCOUNTER — Ambulatory Visit (INDEPENDENT_AMBULATORY_CARE_PROVIDER_SITE_OTHER): Payer: 59 | Admitting: Family Medicine

## 2016-10-06 ENCOUNTER — Encounter: Payer: Self-pay | Admitting: Family Medicine

## 2016-10-06 VITALS — BP 130/70 | HR 68 | Temp 97.7°F | Ht 72.5 in | Wt 263.8 lb

## 2016-10-06 DIAGNOSIS — Z0000 Encounter for general adult medical examination without abnormal findings: Secondary | ICD-10-CM | POA: Diagnosis not present

## 2016-10-06 DIAGNOSIS — Z23 Encounter for immunization: Secondary | ICD-10-CM | POA: Diagnosis not present

## 2016-10-06 DIAGNOSIS — R011 Cardiac murmur, unspecified: Secondary | ICD-10-CM

## 2016-10-06 NOTE — Patient Instructions (Signed)
Take care.  Keep exercising and working on your diet. Update me as needed.   Glad to see you.

## 2016-10-06 NOTE — Progress Notes (Signed)
CPE- See plan.  Routine anticipatory guidance given to patient.  See health maintenance. Tetanus 2014 Flu shot 2017 Shingles shot 2015 PNA shot not due yet.  Colonoscopy 2015. Prostate cancer screening and PSA options (with potential risks and benefits of testing vs not testing) were discussed along with recent recs/guidelines.  He declined testing PSA at this point.  Living will d/w pt. He has one. Would have his wife designated if incapacitated.  Diet and exercise d/w pt. Plantar fasciitis resolved since he retired.  He quit drinking sodas for the last few months.   He is walking about 1-2 miles a day.  HIV and HCV d/w pt.  [facility]   D/w pt about labs, mild inc in TC.  Sugar improved.  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    B hand pain across the B MCPs.  Pain with torquing down a lid. Intermittent pain.    PMH and SH reviewed  Meds, vitals, and allergies reviewed.   ROS: Per HPI.  Unless specifically indicated otherwise in HPI, the patient denies:  General: fever. Eyes: acute vision changes ENT: sore throat Cardiovascular: chest pain Respiratory: SOB GI: vomiting GU: dysuria Musculoskeletal: acute back pain Derm: acute rash Neuro: acute motor dysfunction Psych: worsening mood Endocrine: polydipsia Heme: bleeding Allergy: hayfever  GEN: nad, alert and oriented HEENT: mucous membranes moist NECK: supple w/o LA CV: rrr. Soft SEM noted, w/o radiation to the carotids PULM: ctab, no inc wob ABD: soft, +bs EXT: no edema SKIN: no acute rash, no erythema or swelling on the B MCPs

## 2016-10-06 NOTE — Progress Notes (Signed)
Pre visit review using our clinic review tool, if applicable. No additional management support is needed unless otherwise documented below in the visit note. 

## 2016-10-07 NOTE — Assessment & Plan Note (Signed)
Tetanus 2014 Flu shot 2017 Shingles shot 2015 PNA shot not due yet.  Colonoscopy 2015. Prostate cancer screening and PSA options (with potential risks and benefits of testing vs not testing) were discussed along with recent recs/guidelines.  He declined testing PSA at this point.  Living will d/w pt. He has one. Would have his wife designated if incapacitated.  Diet and exercise d/w pt. Plantar fasciitis resolved since he retired.  He quit drinking sodas for the last few months.   He is walking about 1-2 miles a day.  HIV and HCV d/w pt.  [facility]   D/w pt about labs, mild inc in TC.  Sugar improved.  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

## 2016-10-07 NOTE — Assessment & Plan Note (Signed)
History of mild AS with faint murmur noted. No radiation to the carotids. With his lack of swelling, normal breath sounds, and lack of radiation to the carotids along with the sound of the murmur I suspect that his AS is still mild. Discussed with patient.

## 2017-01-31 ENCOUNTER — Ambulatory Visit (INDEPENDENT_AMBULATORY_CARE_PROVIDER_SITE_OTHER): Payer: 59 | Admitting: Family Medicine

## 2017-01-31 ENCOUNTER — Telehealth: Payer: Self-pay

## 2017-01-31 ENCOUNTER — Encounter: Payer: Self-pay | Admitting: Family Medicine

## 2017-01-31 VITALS — BP 148/68 | HR 74 | Temp 98.0°F | Wt 268.4 lb

## 2017-01-31 DIAGNOSIS — Z6835 Body mass index (BMI) 35.0-35.9, adult: Secondary | ICD-10-CM

## 2017-01-31 DIAGNOSIS — I10 Essential (primary) hypertension: Secondary | ICD-10-CM

## 2017-01-31 DIAGNOSIS — E7800 Pure hypercholesterolemia, unspecified: Secondary | ICD-10-CM | POA: Diagnosis not present

## 2017-01-31 MED ORDER — AMLODIPINE BESYLATE 5 MG PO TABS: 5.0000 mg | ORAL_TABLET | Freq: Every day | ORAL | 2 refills | Status: DC

## 2017-01-31 MED ORDER — PRAVASTATIN SODIUM 20 MG PO TABS: 20.0000 mg | ORAL_TABLET | Freq: Every day | ORAL | 1 refills | Status: DC

## 2017-01-31 NOTE — Patient Instructions (Addendum)
Please take your blood pressure 2-3 times a week and keep a log Follow up with Dr. Damita Dunnings in 1 month, sooner if you are having any symptoms Increase vegetables and fruits in your diet, avoid eating out as much as you can Resume walking, bundle up if it is cold out!   Mediterranean Diet A Mediterranean diet refers to food and lifestyle choices that are based on the traditions of countries located on the [employer]. This way of eating has been shown to help prevent certain conditions and improve outcomes for people who have chronic diseases, like kidney disease and heart disease. What are tips for following this plan? Lifestyle   Cook and eat meals together with your family, when possible.  Drink enough fluid to keep your urine clear or pale yellow.  Be physically active every day. This includes:  Aerobic exercise like running or swimming.  Leisure activities like gardening, walking, or housework.  Get 7-8 hours of sleep each night.  If recommended by your health care provider, drink red wine in moderation. This means 1 glass a day for nonpregnant women and 2 glasses a day for men. A glass of wine equals 5 oz (150 mL). Reading food labels   Check the serving size of packaged foods. For foods such as rice and pasta, the serving size refers to the amount of cooked product, not dry.  Check the total fat in packaged foods. Avoid foods that have saturated fat or trans fats.  Check the ingredients list for added sugars, such as corn syrup. Shopping   At the grocery store, buy most of your food from the areas near the walls of the store. This includes:  Fresh fruits and vegetables (produce).  Grains, beans, nuts, and seeds. Some of these may be available in unpackaged forms or large amounts (in bulk).  Fresh seafood.  Poultry and eggs.  Low-fat dairy products.  Buy whole ingredients instead of prepackaged foods.  Buy fresh fruits and vegetables in-season from local  farmers markets.  Buy frozen fruits and vegetables in resealable bags.  If you do not have access to quality fresh seafood, buy precooked frozen shrimp or canned fish, such as tuna, salmon, or sardines.  Buy small amounts of raw or cooked vegetables, salads, or olives from the deli or salad bar at your store.  Stock your pantry so you always have certain foods on hand, such as olive oil, canned tuna, canned tomatoes, rice, pasta, and beans. Cooking   Cook foods with extra-virgin olive oil instead of using butter or other vegetable oils.  Have meat as a side dish, and have vegetables or grains as your main dish. This means having meat in small portions or adding small amounts of meat to foods like pasta or stew.  Use beans or vegetables instead of meat in common dishes like chili or lasagna.  Experiment with different cooking methods. Try roasting or broiling vegetables instead of steaming or sauteing them.  Add frozen vegetables to soups, stews, pasta, or rice.  Add nuts or seeds for added healthy fat at each meal. You can add these to yogurt, salads, or vegetable dishes.  Marinate fish or vegetables using olive oil, lemon juice, garlic, and fresh herbs. Meal planning   Plan to eat 1 vegetarian meal one day each week. Try to work up to 2 vegetarian meals, if possible.  Eat seafood 2 or more times a week.  Have healthy snacks readily available, such as:  Vegetable sticks with hummus.  Greek yogurt.  Fruit and nut trail mix.  Eat balanced meals throughout the week. This includes:  Fruit: 2-3 servings a day  Vegetables: 4-5 servings a day  Low-fat dairy: 2 servings a day  Fish, poultry, or lean meat: 1 serving a day  Beans and legumes: 2 or more servings a week  Nuts and seeds: 1-2 servings a day  Whole grains: 6-8 servings a day  Extra-virgin olive oil: 3-4 servings a day  Limit red meat and sweets to only a few servings a month What are my food  choices?  Mediterranean diet  Recommended  Grains: Whole-grain pasta. Brown rice. Bulgar wheat. Polenta. Couscous. Whole-wheat bread. [place].  Vegetables: Artichokes. Beets. Broccoli. Cabbage. Carrots. Eggplant. Green beans. Chard. Kale. Spinach. Onions. Leeks. Peas. Squash. Tomatoes. Peppers. Radishes.  Fruits: Apples. Apricots. Avocado. Berries. Bananas. Cherries. Dates. Figs. Grapes. Lemons. Melon. Oranges. Peaches. Plums. Pomegranate.  Meats and other protein foods: Beans. Almonds. Sunflower seeds. Pine nuts. Peanuts. [place]. Salmon. Scallops. Shrimp. [place]. Tilapia. Clams. Oysters. Eggs.  Dairy: Low-fat milk. Cheese. Greek yogurt.  Beverages: Water. Red wine. Herbal tea.  Fats and oils: Extra virgin olive oil. Avocado oil. Grape seed oil.  Sweets and desserts: [place] yogurt with honey. Baked apples. Poached pears. Trail mix.  Seasoning and other foods: Basil. Cilantro. Coriander. Cumin. Mint. Parsley. Sage. Rosemary. Tarragon. Garlic. Oregano. Thyme. Pepper. Balsalmic vinegar. Tahini. Hummus. Tomato sauce. Olives. Mushrooms.  Limit these  Grains: Prepackaged pasta or rice dishes. Prepackaged cereal with added sugar.  Vegetables: Deep fried potatoes (french fries).  Fruits: Fruit canned in syrup.  Meats and other protein foods: Beef. Pork. Lamb. Poultry with skin. Hot dogs. Berniece Salines.  Dairy: Ice cream. Sour cream. Whole milk.  Beverages: Juice. Sugar-sweetened soft drinks. Beer. Liquor and spirits.  Fats and oils: Butter. Canola oil. Vegetable oil. Beef fat (tallow). Lard.  Sweets and desserts: Cookies. Cakes. Pies. Candy.  Seasoning and other foods: Mayonnaise. Premade sauces and marinades.  The items listed may not be a complete list. Talk with your dietitian about what dietary choices are right for you. Summary  The Mediterranean diet includes both food and lifestyle choices.  Eat a variety of fresh fruits and vegetables, beans, nuts, seeds, and whole  grains.  Limit the amount of red meat and sweets that you eat.  Talk with your health care provider about whether it is safe for you to drink red wine in moderation. This means 1 glass a day for nonpregnant women and 2 glasses a day for men. A glass of wine equals 5 oz (150 mL). This information is not intended to replace advice given to you by your health care provider. Make sure you discuss any questions you have with your health care provider. Document Released: 07/08/2016 Document Revised: 08/10/2016 Document Reviewed: 07/08/2016 Elsevier Interactive Patient Education  2017 [employer]. How to Take Your Blood Pressure Blood pressure is a measurement of how strongly your blood is pressing against the walls of your arteries. Arteries are blood vessels that carry blood from your heart throughout your body. Your health care provider takes your blood pressure at each office visit. You can also take your own blood pressure at home with a blood pressure machine. You may need to take your own blood pressure:  To confirm a diagnosis of high blood pressure (hypertension).  To monitor your blood pressure over time.  To make sure your blood pressure medicine is working. Supplies needed: To take your blood pressure, you will need a blood  pressure machine. You can buy a blood pressure machine, or blood pressure monitor, at most drugstores or online. There are several types of home blood pressure monitors. When choosing one, consider the following:  Choose a monitor that has an arm cuff.  Choose a monitor that wraps snugly around your upper arm. You should be able to fit only one finger between your arm and the cuff.  Do not choose a monitor that measures your blood pressure from your wrist or finger. Your health care provider can suggest a reliable monitor that will meet your needs. How to prepare To get the most accurate reading, avoid the following for 30 minutes before you check your blood  pressure:  Drinking caffeine.  Drinking alcohol.  Eating.  Smoking.  Exercising. Five minutes before you check your blood pressure:  Empty your bladder.  Sit quietly without talking in a dining chair, rather than in a soft couch or armchair. How to take your blood pressure To check your blood pressure, follow the instructions in the manual that came with your blood pressure monitor. If you have a digital blood pressure monitor, the instructions may be as follows: 1. Sit up straight. 2. Place your feet on the floor. Do not cross your ankles or legs. 3. Rest your left arm at the level of your heart on a table or desk or on the arm of a chair. 4. Pull up your shirt sleeve. 5. Wrap the blood pressure cuff around the upper part of your left arm, 1 inch (2.5 cm) above your elbow. It is best to wrap the cuff around bare skin. 6. Fit the cuff snugly around your arm. You should be able to place only one finger between the cuff and your arm. 7. Position the cord inside the groove of your elbow. 8. Press the power button. 9. Sit quietly while the cuff inflates and deflates. 10. Read the digital reading on the monitor screen and write it down (record it). 11. Wait 2-3 minutes, then repeat the steps, starting at step 1. What does my blood pressure reading mean? A blood pressure reading consists of a higher number over a lower number. Ideally, your blood pressure should be below 120/80. The first ("top") number is called the systolic pressure. It is a measure of the pressure in your arteries as your heart beats. The second ("bottom") number is called the diastolic pressure. It is a measure of the pressure in your arteries as the heart relaxes. Blood pressure is classified into four stages. The following are the stages for adults who do not have a short-term serious illness or a chronic condition. Systolic pressure and diastolic pressure are measured in a unit called mm Hg. Normal   Systolic  pressure: below 120.  Diastolic pressure: below 80. Elevated   Systolic pressure: [id number].  Diastolic pressure: below 80. Hypertension stage 1   Systolic pressure: 0000000.  Diastolic pressure: [id number]. Hypertension stage 2   Systolic pressure: [id number] or above.  Diastolic pressure: 90 or above. You can have prehypertension or hypertension even if only the systolic or only the diastolic number in your reading is higher than normal. Follow these instructions at home:  Check your blood pressure as often as recommended by your health care provider.  Take your monitor to the next appointment with your health care provider to make sure:  That you are using it correctly.  That it provides accurate readings.  Be sure you understand what your goal blood pressure numbers are.  Tell your health care provider if you are having any side effects from blood pressure medicine. Contact a health care provider if:  Your blood pressure is consistently high. Get help right away if:  Your systolic blood pressure is higher than 180.  Your diastolic blood pressure is higher than 110. This information is not intended to replace advice given to you by your health care provider. Make sure you discuss any questions you have with your health care provider. Document Released: 04/23/2016 Document Revised: 07/06/2016 Document Reviewed: 04/23/2016 Elsevier Interactive Patient Education  2017 [employer].

## 2017-01-31 NOTE — Progress Notes (Signed)
Pre visit review using our clinic review tool, if applicable. No additional management support is needed unless otherwise documented below in the visit note. 

## 2017-01-31 NOTE — Progress Notes (Signed)
Subjective:    Patient ID: [name], male    DOB: [birth year], 66 y.o.   MRN: [id number]  HPI This is a 66 yo male who presents today with elevated blood pressure. Took his blood pressure with his wife's automatic cuff and got reading of 185. Came to the office earlier today and his blood pressure was checked by Leticia Penna, RN who got 166/70 and 148/72. He has had some lightheadedness "buzzyfeeling", facial flushing and headache off and on for 3 weeks. No headache or unusual feeling now. Was at [employer] a couple of weeks ago and walked about 10 miles a day. No SOB, no chest pain.  Is surprised at weight gain. Stopped drinking soda several months ago. He is retired and lives in [place] most of the time while his wife continues her job in [place]. He admits to eating out most of the time when alone. Not as active in winter.   Past Medical History:  Diagnosis Date  . Allergic rhinitis 04/2006  . GERD (gastroesophageal reflux disease)   . 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 Plantar fasciitis    Past Surgical History:  Procedure Laterality Date  . CHOLECYSTECTOMY  1999  . KNEE ARTHROSCOPY     left, multiple- 5 times, last 2006, 1 time on the R   Family History  Problem Relation Age of Onset  . Hypertension Mother   . Stroke Mother     multiple strokes  . Cancer Maternal Grandmother     uterine  . Diabetes Neg Hx   . Depression Neg Hx   . Colon cancer Neg Hx   . Prostate cancer Neg Hx   . Pancreatic cancer Neg Hx   . Rectal cancer Neg Hx   . Stomach cancer Neg Hx    Social History  Substance Use Topics  . Smoking status: Former Smoker    Packs/day: 1.00    Years: 30.00    Types: Cigarettes    Quit date: 11/29/2002  . Smokeless tobacco: Never Used  . Alcohol use 0.6 oz/week    1 Standard drinks or equivalent per week     Comment: rarely    Review of Systems     Objective:   Physical Exam    Constitutional: He is oriented to person, place, and time. He appears well-developed and well-nourished. No distress.  Obese.   HENT:  Head: Normocephalic and atraumatic.  Eyes: Conjunctivae and EOM are normal. Pupils are equal, round, and reactive to light. Right eye exhibits no discharge. Left eye exhibits no discharge.  Neck: Normal range of motion. Neck supple. Carotid bruit is not present.  Cardiovascular: Normal rate, regular rhythm and normal heart sounds.   Pulmonary/Chest: Effort normal and breath sounds normal.  Musculoskeletal: Normal range of motion.  Lymphadenopathy:    He has no cervical adenopathy.  Neurological: He is alert and oriented to person, place, and time. He has normal reflexes. No cranial nerve deficit. Coordination normal.  Skin: Skin is warm and dry. He is not diaphoretic.  Psychiatric: He has a normal mood and affect. His behavior is normal. Judgment and thought content normal.  Vitals reviewed.     BP (!) 148/68 (BP Location: Right Arm, Patient Position: Sitting, Cuff Size: Large)   Pulse 96   Temp 98 F (36.7 C) (Oral)   Wt 268 lb 6.4 oz (121.7 kg)   SpO2 (!) 74%   BMI  35.90 kg/m  Wt Readings from Last 3 Encounters:  01/31/17 268 lb 6.4 oz (121.7 kg)  10/06/16 263 lb 12 oz (119.6 kg)  05/20/16 259 lb (117.5 kg)   BP Readings from Last 3 Encounters:  01/31/17 (!) 148/68  10/06/16 130/70  05/20/16 140/88   EKG- NSR    Assessment & Plan:  1. Essential hypertension - amLODipine (NORVASC) 5 MG tablet; Take 1 tablet (5 mg total) by mouth daily.  Dispense: 30 tablet; Refill: 2 - EKG 12-Lead - CBC with Differential/Platelet - Comprehensive metabolic panel - Hemoglobin A1c - TSH - pravastatin (PRAVACHOL) 20 MG tablet; Take 1 tablet (20 mg total) by mouth at bedtime.  Dispense: 90 tablet; Refill: 1  2. BMI 35.0-35.9,adult - provided information about Mediterranean Diet and encouraged him to eat out less and increase activity  - EKG 12-Lead -  CBC with Differential/Platelet - Comprehensive metabolic panel - Hemoglobin A1c - TSH - pravastatin (PRAVACHOL) 20 MG tablet; Take 1 tablet (20 mg total) by mouth at bedtime.  Dispense: 90 tablet; Refill: 1  3. Hypercholesteremia - TSH - pravastatin (PRAVACHOL) 20 MG tablet; Take 1 tablet (20 mg total) by mouth at bedtime.  Dispense: 90 tablet; Refill: 1 - follow up with Dr. Damita Dunnings in 1 month, sooner if needed  Clarene Reamer, FNP-BC  [facility], [place]  01/31/2017 2:17 PM

## 2017-01-31 NOTE — Telephone Encounter (Signed)
Patient calls to report recent elevated readings of 180's/90's and has been having intermittent lightheadedness, facial flushing and Ha.  He would like to come by for blood pressure check to confirm readings obtained on at home monitor before seeing a physician.  He denies any current symptoms including SOB, and/or chest pain.  Given recent symptoms and readings, patient will need to be evaluated by a provider.    Appointment made with Tor Netters, NP for 1:30pm this afternoon 01/31/17.  Patient would like to come now just to have blood pressure checked and agrees to come back at 1:30 to see provider.

## 2017-01-31 NOTE — Telephone Encounter (Signed)
Patient arrives within five minutes for bp check only.    This Probation officer obtains the following readings: Right arm:  166/70  Left arm:  148/72 At home readings:  180's/90's  He denies any acute symptoms at the moment but does report intermittent symptoms (lightheadedness, facial flushing and HA) X 3 weeks.  He does not take any blood pressure medication.    In addition, while driving recently he began feeling tingling to his face/head and almost as if he was going to "black out".  Symptoms to the point where he pulled car over until feeling better.  Does report family hx of CVA and has had some borderline elevated blood sugar readings in the past.    Last saw Dr. Damita Dunnings for CPE on 10/06/16.   He will see Tor Netters at 1:30pm today.  (01/31/17)

## 2017-01-31 NOTE — Telephone Encounter (Signed)
I just saw this now.  1:54 PM App help of all involved.   Thanks.

## 2017-02-01 LAB — COMPREHENSIVE METABOLIC PANEL
ALK PHOS: 84 IU/L (ref 39–117)
ALT: 45 IU/L — ABNORMAL HIGH (ref 0–44)
AST: 26 IU/L (ref 0–40)
Albumin/Globulin Ratio: 1.7 (ref 1.2–2.2)
Albumin: 4 g/dL (ref 3.6–4.8)
BILIRUBIN TOTAL: 0.3 mg/dL (ref 0.0–1.2)
BUN/Creatinine Ratio: 16 (ref 10–24)
BUN: 13 mg/dL (ref 8–27)
CALCIUM: 8.9 mg/dL (ref 8.6–10.2)
CHLORIDE: 102 mmol/L (ref 96–106)
CO2: 26 mmol/L (ref 18–29)
Creatinine, Ser: 0.83 mg/dL (ref 0.76–1.27)
GFR calc Af Amer: 107 mL/min/{1.73_m2} (ref >59)
GFR, EST NON AFRICAN AMERICAN: 93 mL/min/{1.73_m2} (ref >59)
GLOBULIN, TOTAL: 2.3 g/dL (ref 1.5–4.5)
Glucose: 89 mg/dL (ref 65–99)
POTASSIUM: 4.1 mmol/L (ref 3.5–5.2)
SODIUM: 144 mmol/L (ref 134–144)
Total Protein: 6.3 g/dL (ref 6.0–8.5)

## 2017-02-01 LAB — CBC WITH DIFFERENTIAL/PLATELET
BASOS: 1 %
Basophils Absolute: 0 10*3/uL (ref 0.0–0.2)
EOS (ABSOLUTE): 0.2 10*3/uL (ref 0.0–0.4)
EOS: 3 %
HEMATOCRIT: 46.3 % (ref 37.5–51.0)
Hemoglobin: 15.9 g/dL (ref 13.0–17.7)
Immature Grans (Abs): 0.1 10*3/uL (ref 0.0–0.1)
Immature Granulocytes: 1 %
LYMPHS ABS: 2.2 10*3/uL (ref 0.7–3.1)
Lymphs: 27 %
MCH: 31.5 pg (ref 26.6–33.0)
MCHC: 34.3 g/dL (ref 31.5–35.7)
MCV: 92 fL (ref 79–97)
MONOS ABS: 0.8 10*3/uL (ref 0.1–0.9)
Monocytes: 9 %
NEUTROS ABS: 5 10*3/uL (ref 1.4–7.0)
Neutrophils: 59 %
PLATELETS: 265 10*3/uL (ref 150–379)
RBC: 5.04 x10E6/uL (ref 4.14–5.80)
RDW: 13.2 % (ref 12.3–15.4)
WBC: 8.2 10*3/uL (ref 3.4–10.8)

## 2017-02-01 LAB — HEMOGLOBIN A1C
Est. average glucose Bld gHb Est-mCnc: 100 mg/dL
HEMOGLOBIN A1C: 5.1 % (ref 4.8–5.6)

## 2017-02-01 LAB — TSH: TSH: 1.46 u[IU]/mL (ref 0.450–4.500)

## 2017-02-21 ENCOUNTER — Other Ambulatory Visit: Payer: Self-pay

## 2017-02-21 DIAGNOSIS — I10 Essential (primary) hypertension: Secondary | ICD-10-CM

## 2017-02-21 DIAGNOSIS — Z6835 Body mass index (BMI) 35.0-35.9, adult: Secondary | ICD-10-CM

## 2017-02-21 DIAGNOSIS — E7800 Pure hypercholesterolemia, unspecified: Secondary | ICD-10-CM

## 2017-02-21 MED ORDER — PRAVASTATIN SODIUM 20 MG PO TABS: 20.0000 mg | ORAL_TABLET | Freq: Every day | ORAL | 0 refills | Status: DC

## 2017-02-21 MED ORDER — AMLODIPINE BESYLATE 5 MG PO TABS: 5.0000 mg | ORAL_TABLET | Freq: Every day | ORAL | 0 refills | Status: DC

## 2017-02-21 NOTE — Telephone Encounter (Signed)
Pt needs amlodipine and prav[facility]atin [facility]. Pt will change to medicare soon and will not be using [facility]. refilled amlodipine and pravastatin # 90 for each med. Pt voiced understanding and will set up acct with [facility].

## 2017-03-04 ENCOUNTER — Encounter: Payer: Self-pay | Admitting: Family Medicine

## 2017-03-04 ENCOUNTER — Ambulatory Visit (INDEPENDENT_AMBULATORY_CARE_PROVIDER_SITE_OTHER): Payer: 59 | Admitting: Family Medicine

## 2017-03-04 VITALS — BP 134/76 | HR 67 | Temp 97.8°F | Wt 265.2 lb

## 2017-03-04 DIAGNOSIS — N529 Male erectile dysfunction, unspecified: Secondary | ICD-10-CM

## 2017-03-04 DIAGNOSIS — Z125 Encounter for screening for malignant neoplasm of prostate: Secondary | ICD-10-CM

## 2017-03-04 DIAGNOSIS — I10 Essential (primary) hypertension: Secondary | ICD-10-CM | POA: Diagnosis not present

## 2017-03-04 DIAGNOSIS — E7800 Pure hypercholesterolemia, unspecified: Secondary | ICD-10-CM

## 2017-03-04 MED ORDER — SILDENAFIL CITRATE 20 MG PO TABS: 60.0000 mg | ORAL_TABLET | Freq: Every day | ORAL | 12 refills | Status: DC | PRN

## 2017-03-04 NOTE — Patient Instructions (Addendum)
Recheck labs before/at a visit in the fall.  Fasting labs.   Don't change your meds for now.  Keep working on your weight.  Take care.  Glad to see you.

## 2017-03-04 NOTE — Progress Notes (Signed)
BP was prev sig elevated and he had OV last month.  Labs done at that point.  Started on amlodipine and pravastatin.    He feels better in the meantime.  He was prev flushed appearing, others noted, but that is resolved in the meantime.  No CP, SOB, BLE edema.    Some mild back pain in the AM but better as the day goes on.  No diffuse aches.  No typical statin induced pain.  D/w pt.    Has checked BP at home- his cuff may be reading a few points higher than ours.   BP has been 120-140s/60-80s at home.    He is still back and forth to [place], most of the time there.   He is remodeling his house and that affects his diet.    He gave up sodas in 2018.  Down 3 lbs in the last month.  He is walking more.  I thanked him for his effort.    ED.  Ongoing, didn't start with CCB use.  D/w pt.  Asking about options.  It would be okay to try viagra, has used prev with relief.    We can recheck labs later on, d/w pt.  Prostate cancer screening and PSA options (with potential risks and benefits of testing vs not testing) were discussed along with recent recs/guidelines.  He opted for testing PSA later on.    PMH and SH reviewed  ROS: Per HPI unless specifically indicated in ROS section   Meds, vitals, and allergies reviewed.   GEN: nad, alert and oriented HEENT: mucous membranes moist NECK: supple w/o LA CV: rrr, soft systolic ejection murmur consistent with mild aortic stenosis noted on exam, old finding. PULM: ctab, no inc wob ABD: soft, +bs EXT: no edema

## 2017-03-04 NOTE — Progress Notes (Signed)
Pre visit review using our clinic review tool, if applicable. No additional management support is needed unless otherwise documented below in the visit note. 

## 2017-03-06 ENCOUNTER — Encounter: Payer: Self-pay | Admitting: Family Medicine

## 2017-03-06 DIAGNOSIS — N529 Male erectile dysfunction, unspecified: Secondary | ICD-10-CM | POA: Insufficient documentation

## 2017-03-06 DIAGNOSIS — I10 Essential (primary) hypertension: Secondary | ICD-10-CM | POA: Insufficient documentation

## 2017-03-06 NOTE — Assessment & Plan Note (Signed)
Recheck PSA later in the year, ordered.

## 2017-03-06 NOTE — Assessment & Plan Note (Signed)
Okay to try sildenafil. Routine cautions and dosing discussed with patient. Update me as needed. He agrees.

## 2017-03-06 NOTE — Assessment & Plan Note (Signed)
Previous labs discussed with patient. Tolerating statin. No adverse effect. Recheck labs later in the year. Continue current medications for now. He agrees.

## 2017-03-06 NOTE — Assessment & Plan Note (Signed)
Reasonable control. Continue as is. Previous labs discussed with patient. Discussed with patient about diet and exercise. He agrees.

## 2017-03-10 ENCOUNTER — Telehealth: Payer: Self-pay | Admitting: *Deleted

## 2017-03-10 NOTE — Telephone Encounter (Signed)
PA received for Sildenafil.  Patient advised that insurance will not cover the cost of the prescription and info for [facility] or [facility] relayed to patient.

## 2017-03-11 ENCOUNTER — Other Ambulatory Visit: Payer: Self-pay | Admitting: *Deleted

## 2017-03-11 MED ORDER — SILDENAFIL CITRATE 20 MG PO TABS: 60.0000 mg | ORAL_TABLET | Freq: Every day | ORAL | 12 refills | Status: AC | PRN

## 2017-06-06 ENCOUNTER — Telehealth: Payer: Self-pay | Admitting: Internal Medicine

## 2017-06-06 DIAGNOSIS — G4733 Obstructive sleep apnea (adult) (pediatric): Secondary | ICD-10-CM

## 2017-06-06 NOTE — Telephone Encounter (Signed)
Pt calling stating his CPAP sleep mask broke  And he needs an [facility]  Please advise.

## 2017-06-06 NOTE — Telephone Encounter (Signed)
Patient notified order placed for new Mask.

## 2017-07-25 ENCOUNTER — Telehealth: Payer: Self-pay | Admitting: Family Medicine

## 2017-07-25 NOTE — Telephone Encounter (Signed)
Patient advised.   Patient is in [place] right now but will keep a check on his BP and act accordingly.

## 2017-07-25 NOTE — Telephone Encounter (Signed)
Patient Name: [name] DOB: 11/27/1951 Initial Comment Caller states he's on BP medication, he's having some edema. Ankle and feet are swelled, some in legs. Nurse Assessment Nurse: Vallery Sa, RN, Cathy Date/Time (Eastern Time): 07/25/2017 9:11:37 AM Confirm and document reason for call. If symptomatic, describe symptoms. ---[name] states he developed swelling of both ankles and feet about 2-3 weeks ago that he thinks could be related to his blood pressure medications. No chest pain or difficulty breathing. No fever. No pain. Alert and responsive. Does the patient have any new or worsening symptoms? ---Yes Will a triage be completed? ---Yes Related visit to physician within the last 2 weeks? ---No Does the PT have any chronic conditions? (i.e. diabetes, asthma, etc.) ---Yes List chronic conditions. ---High Blood Pressure and Cholesterol, Aortic Stenosis Is this a behavioral health or substance abuse call? ---No Guidelines Guideline Title Affirmed Question Affirmed Notes Leg Swelling and Edema [1] MODERATE leg swelling (e.g., swelling extends up to knees) AND [2] new onset or worsening Final Disposition User See Physician within 24 Hours [place], RN, Tye [place] Comments [name] declined the See Within 24 Hour disposition because he is out of town. He doesn't want to go to an urgent care where he is currently located. He asks to send a message to the MD asking if his blood pressure medication should be changed. Encouraged to call back with any concerns or questions. Referrals GO TO FACILITY OTHER - SPECIFY Disagree/Comply: Disagree Disagree/Comply Reason: Disagree with instructions

## 2017-07-25 NOTE — Telephone Encounter (Signed)
If any CP or SOB, then needs eval ASAP.  Cut back on any extra salt. If BP isn't sig elevated (meaning if BP is still <150/<90), then okay to cut back on the amlodipine to 2.5mg  a day.  That med can commonly cause some swelling.   Have him keep checking BP and update [place] in a few days, sooner if needed.  Thanks.

## 2017-08-04 NOTE — Telephone Encounter (Addendum)
Pt left v/m has has been taking amlodipine 2.5 mg daily and still having problems with swelling in lower legs, ankles and feet. Pt wants to know if Dr Damita Dunnings wants to refill amlodipine or should pt stop amlodipine since 2.5 mg is causing pt to swell(pt said not swelling as bad since cut pill in half but still swelling)..pts BP 140/90 about one wk ago. No SOB. Pt is still in [place]. Pt request cb. [facility] [place]

## 2017-08-04 NOTE — Addendum Note (Signed)
Addended by: Tonia Ghent on: 08/04/2017 01:58 PM   Modules accepted: Orders

## 2017-08-04 NOTE — Telephone Encounter (Signed)
Patient advised and understands directions.

## 2017-08-04 NOTE — Telephone Encounter (Signed)
I would stop amlodipine.  See if the edema gets better.   If BP is consistently >140/>90 then we need to consider other options, ie lisinopril.  Update me in about 7-10 days.   Thanks.

## 2017-09-02 ENCOUNTER — Encounter: Payer: Self-pay | Admitting: Family Medicine

## 2017-09-02 ENCOUNTER — Ambulatory Visit (INDEPENDENT_AMBULATORY_CARE_PROVIDER_SITE_OTHER): Payer: Medicare Other | Admitting: Family Medicine

## 2017-09-02 VITALS — BP 120/62 | HR 74 | Temp 97.7°F | Wt 264.0 lb

## 2017-09-02 DIAGNOSIS — Z125 Encounter for screening for malignant neoplasm of prostate: Secondary | ICD-10-CM | POA: Diagnosis not present

## 2017-09-02 DIAGNOSIS — Z23 Encounter for immunization: Secondary | ICD-10-CM

## 2017-09-02 DIAGNOSIS — E7800 Pure hypercholesterolemia, unspecified: Secondary | ICD-10-CM | POA: Diagnosis not present

## 2017-09-02 DIAGNOSIS — I10 Essential (primary) hypertension: Secondary | ICD-10-CM | POA: Diagnosis not present

## 2017-09-02 NOTE — Progress Notes (Signed)
He had edema while on amlodipine recently.  He had done well on med until recently.  He stopped the amlodipine in the meantime his swelling is sig better (trace edema present now BLE).  His BP was variable when off med, increasing on his home monitor, then started to normalize.  This Monday his SBP was 140; 129 on Wednesday.  Not on BP meds at this point.  He had been trying to cut out salt in the meantime.    Elevated Cholesterol: Using medications without problems:yes Muscle aches: no Diet compliance: encouraged.   Exercise: walking.   Labs pending.    Prostate cancer screening and PSA options (with potential risks and benefits of testing vs not testing) were discussed along with recent recs/guidelines.  He opted in for testing PSA at this point.    Meds, vitals, and allergies reviewed.   ROS: Per HPI unless specifically indicated in ROS section   GEN: nad, alert and oriented HEENT: mucous membranes moist NECK: supple w/o LA CV: rrr.  soft systolic ejection murmur consistent with mild aortic stenosis noted on exam, old finding. PULM: ctab, no inc wob ABD: soft, +bs EXT: trace BLE edema

## 2017-09-02 NOTE — Addendum Note (Signed)
Addended by: Frutoso Chase A on: 09/02/2017 05:07 PM   Modules accepted: Orders

## 2017-09-02 NOTE — Patient Instructions (Signed)
Go to the lab on the way out.  We'll contact you with your lab report. Keep working on diet and exercise in the meantime.   If your pressure goes up then let me know.  Take care.  Glad to see you.

## 2017-09-03 LAB — LIPID PANEL
Chol/HDL Ratio: 3.5 ratio (ref 0.0–5.0)
Cholesterol, Total: 131 mg/dL (ref 100–199)
HDL: 37 mg/dL — ABNORMAL LOW (ref >39)
LDL Calculated: 68 mg/dL (ref 0–99)
Triglycerides: 131 mg/dL (ref 0–149)
VLDL CHOLESTEROL CAL: 26 mg/dL (ref 5–40)

## 2017-09-03 LAB — COMPREHENSIVE METABOLIC PANEL
A/G RATIO: 2 (ref 1.2–2.2)
ALK PHOS: 118 IU/L — AB (ref 39–117)
ALT: 32 IU/L (ref 0–44)
AST: 25 IU/L (ref 0–40)
Albumin: 4.3 g/dL (ref 3.6–4.8)
BILIRUBIN TOTAL: 0.4 mg/dL (ref 0.0–1.2)
BUN/Creatinine Ratio: 15 (ref 10–24)
BUN: 15 mg/dL (ref 8–27)
CHLORIDE: 101 mmol/L (ref 96–106)
CO2: 26 mmol/L (ref 20–29)
Calcium: 9.4 mg/dL (ref 8.6–10.2)
Creatinine, Ser: 1.01 mg/dL (ref 0.76–1.27)
GFR calc Af Amer: 90 mL/min/{1.73_m2} (ref >59)
GFR, EST NON AFRICAN AMERICAN: 78 mL/min/{1.73_m2} (ref >59)
GLOBULIN, TOTAL: 2.1 g/dL (ref 1.5–4.5)
Glucose: 94 mg/dL (ref 65–99)
POTASSIUM: 4.5 mmol/L (ref 3.5–5.2)
SODIUM: 144 mmol/L (ref 134–144)
Total Protein: 6.4 g/dL (ref 6.0–8.5)

## 2017-09-03 LAB — PSA: PROSTATE SPECIFIC AG, SERUM: 1.1 ng/mL (ref 0.0–4.0)

## 2017-09-04 NOTE — Assessment & Plan Note (Signed)
See notes on labs. 

## 2017-09-04 NOTE — Assessment & Plan Note (Signed)
Continue statin, see notes on labs.  D/w pt about diet and exercise.  Okay for outpatient f/u

## 2017-09-04 NOTE — Assessment & Plan Note (Signed)
BP reasonable, would continue off meds for now.  Check basic labs today.  See notes on labs.  He agrees.  Allergy/intolerance list updated.  He agrees.

## 2017-10-19 ENCOUNTER — Other Ambulatory Visit: Payer: Self-pay | Admitting: Family Medicine

## 2017-10-19 DIAGNOSIS — I10 Essential (primary) hypertension: Secondary | ICD-10-CM

## 2017-10-19 DIAGNOSIS — E7800 Pure hypercholesterolemia, unspecified: Secondary | ICD-10-CM

## 2017-10-19 DIAGNOSIS — Z6835 Body mass index (BMI) 35.0-35.9, adult: Secondary | ICD-10-CM

## 2017-12-09 ENCOUNTER — Telehealth: Payer: Self-pay

## 2017-12-09 NOTE — Telephone Encounter (Signed)
Patient moved to [place] deleting recall .

## 2018-03-20 ENCOUNTER — Encounter: Payer: Self-pay | Admitting: Emergency Medicine

## 2018-03-20 ENCOUNTER — Other Ambulatory Visit: Payer: Self-pay

## 2018-03-20 DIAGNOSIS — I10 Essential (primary) hypertension: Secondary | ICD-10-CM | POA: Insufficient documentation

## 2018-03-20 DIAGNOSIS — Z79899 Other long term (current) drug therapy: Secondary | ICD-10-CM | POA: Insufficient documentation

## 2018-03-20 DIAGNOSIS — Z87891 Personal history of nicotine dependence: Secondary | ICD-10-CM | POA: Diagnosis not present

## 2018-03-20 DIAGNOSIS — Z7982 Long term (current) use of aspirin: Secondary | ICD-10-CM | POA: Insufficient documentation

## 2018-03-20 DIAGNOSIS — R197 Diarrhea, unspecified: Secondary | ICD-10-CM | POA: Diagnosis not present

## 2018-03-20 DIAGNOSIS — R112 Nausea with vomiting, unspecified: Secondary | ICD-10-CM | POA: Insufficient documentation

## 2018-03-20 LAB — COMPREHENSIVE METABOLIC PANEL
ALBUMIN: 4.2 g/dL (ref 3.5–5.0)
ALT: 37 U/L (ref 17–63)
AST: 39 U/L (ref 15–41)
Alkaline Phosphatase: 97 U/L (ref 38–126)
Anion gap: 10 (ref 5–15)
BILIRUBIN TOTAL: 1.4 mg/dL — AB (ref 0.3–1.2)
BUN: 21 mg/dL — AB (ref 6–20)
CHLORIDE: 105 mmol/L (ref 101–111)
CO2: 23 mmol/L (ref 22–32)
CREATININE: 0.66 mg/dL (ref 0.61–1.24)
Calcium: 9 mg/dL (ref 8.9–10.3)
GFR calc Af Amer: >60 mL/min (ref >60)
GFR calc non Af Amer: >60 mL/min (ref >60)
GLUCOSE: 131 mg/dL — AB (ref 65–99)
POTASSIUM: 3.5 mmol/L (ref 3.5–5.1)
Sodium: 138 mmol/L (ref 135–145)
Total Protein: 7.3 g/dL (ref 6.5–8.1)

## 2018-03-20 LAB — CBC
HCT: 48.3 % (ref 40.0–52.0)
HEMOGLOBIN: 17.5 g/dL (ref 13.0–18.0)
MCH: 32.4 pg (ref 26.0–34.0)
MCHC: 36.2 g/dL — ABNORMAL HIGH (ref 32.0–36.0)
MCV: 89.7 fL (ref 80.0–100.0)
Platelets: 304 10*3/uL (ref 150–440)
RBC: 5.38 MIL/uL (ref 4.40–5.90)
RDW: 12.6 % (ref 11.5–14.5)
WBC: 10.2 10*3/uL (ref 3.8–10.6)

## 2018-03-20 LAB — LIPASE, BLOOD: Lipase: 20 U/L (ref 11–51)

## 2018-03-20 MED ORDER — ONDANSETRON 4 MG PO TBDP
4.0000 mg | ORAL_TABLET | Freq: Once | ORAL | Status: AC
  Administered 2018-03-20: 4 mg via ORAL
  Filled 2018-03-20: qty 1

## 2018-03-20 NOTE — ED Triage Notes (Signed)
Patient ambulatory to triage with steady gait, without difficulty or distress noted; pt reports N/V/D today; denies abd pain

## 2018-03-21 ENCOUNTER — Emergency Department
Admission: EM | Admit: 2018-03-21 | Discharge: 2018-03-21 | Disposition: A | Discharge status: Home / Self Care | Payer: Medicare Other | Attending: Emergency Medicine | Admitting: Emergency Medicine

## 2018-03-21 DIAGNOSIS — R112 Nausea with vomiting, unspecified: Secondary | ICD-10-CM | POA: Diagnosis not present

## 2018-03-21 DIAGNOSIS — R197 Diarrhea, unspecified: Secondary | ICD-10-CM

## 2018-03-21 MED ORDER — ONDANSETRON 4 MG PO TBDP: ORAL_TABLET | ORAL | 0 refills | Status: AC

## 2018-03-21 MED ORDER — ONDANSETRON HCL 4 MG/2ML IJ SOLN
4.0000 mg | INTRAMUSCULAR | Status: AC
  Administered 2018-03-21: 4 mg via INTRAVENOUS
  Filled 2018-03-21: qty 2

## 2018-03-21 MED ORDER — SODIUM CHLORIDE 0.9 % IV BOLUS
1000.0000 mL | Freq: Once | INTRAVENOUS | Status: AC
  Administered 2018-03-21: 1000 mL via INTRAVENOUS

## 2018-03-21 NOTE — Discharge Instructions (Signed)

## 2018-03-21 NOTE — ED Provider Notes (Signed)
[facility] Emergency Department Provider Note  ____________________________________________   First MD Initiated Contact with Patient 03/21/18 0109     (approximate)  I have reviewed the triage vital signs and the nursing notes.   HISTORY  Chief Complaint Emesis and Diarrhea    HPI [name] is a 67 y.o. male with no significant chronic medical history who presents for evaluation of acute onset vomiting and diarrhea.  He reports that the symptoms started at 5 AM last night, about 20 hours ago.  He states that he awoke at his normal time but he could tell he was going to be sick.  He went to the bathroom and it started with explosive diarrhea, and within an hour or 2 developed into vomiting as well.  He had numerous episodes of vomiting and diarrhea all throughout the day.  Nothing in particular made it better or worse and the symptoms were both severe.  He is sore, but he is not having abdominal pain per se.  He denies fever/chills, chest pain, shortness of breath.  He has had no blood in his stools which have been foul-smelling and explosive but also watery.  No one else in his family has been ill recently but he was recently around some children due to a family get together and wonders if he could have gotten something from them.  Since getting Zofran by mouth in triage he is feeling better and has not had any additional vomiting.  He was able to tolerate an Imodium earlier in the day without it "coming back out".  He says that he feels some generalized weakness and feels dehydrated due to not being able to keep anything down for almost 24 hours.  Past Medical History:  Diagnosis Date  . Allergic rhinitis 04/2006  . Erectile dysfunction   . GERD (gastroesophageal reflux disease)   . Hyperlipidemia   . Hypertension   . 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 Plantar fasciitis     Patient  Active Problem List   Diagnosis Date Noted  . Hypertension 03/06/2017  . Erectile dysfunction 03/06/2017  . Advance care planning 08/19/2015  . Sleep apnea 08/19/2015  . Blocked tear duct 04/08/2015  . Edema 07/11/2014  . Murmur 05/17/2013  . Routine general medical examination at a health care facility 05/03/2013  . Atypical chest pain 05/03/2013  . VERTIGO 05/11/2010  . FATIGUE 04/06/2010  . Hyperglycemia 04/06/2010  . COLONIC POLYPS 03/06/2009  . PURE HYPERCHOLESTEROLEMIA 02/22/2008  . ALLERGIC RHINITIS 02/22/2008  . Prostate cancer screening 02/22/2008    Past Surgical History:  Procedure Laterality Date  . CHOLECYSTECTOMY  1999  . KNEE ARTHROSCOPY     left, multiple- 5 times, last 2006, 1 time on the R    Prior to Admission medications   Medication Sig Start Date End Date Taking? Authorizing Provider  aspirin 81 MG tablet Take 81 mg by mouth daily.      [provider]  fexofenadine (ALLEGRA) 180 MG tablet Take 180 mg by mouth daily.      [provider]  Multiple Vitamin (MULTIVITAMIN) tablet Take 1 tablet by mouth daily.      [provider]  omeprazole (PRILOSEC) 20 MG capsule Take 1 capsule (20 mg total) by mouth daily. 05/01/13   Tonia Ghent, MD  ondansetron (ZOFRAN ODT) 4 MG disintegrating tablet Allow 1-2 tablets to dissolve in your mouth every 8  hours as needed for nausea/vomiting 03/21/18   Hinda Kehr, MD  pravastatin (PRAVACHOL) 20 MG tablet TAKE 1 TABLET (20 MG TOTAL) BY MOUTH AT BEDTIME. 10/19/17   Tonia Ghent, MD  sildenafil (REVATIO) 20 MG tablet Take 3-5 tablets (60-100 mg total) by mouth daily as needed. 03/11/17   Tonia Ghent, MD  triamcinolone (NASACORT) 55 MCG/ACT nasal inhaler PLACE 1 SPRAY INTO THE NOSE DAILY. 06/06/13   Tonia Ghent, MD    Allergies Amlodipine  Family History  Problem Relation Age of Onset  . Hypertension Mother   . Stroke Mother        multiple strokes  . Cancer Maternal Grandmother         uterine  . Diabetes Neg Hx   . Depression Neg Hx   . Colon cancer Neg Hx   . Prostate cancer Neg Hx   . Pancreatic cancer Neg Hx   . Rectal cancer Neg Hx   . Stomach cancer Neg Hx     Social History Social History   Tobacco Use  . Smoking status: Former Smoker    Packs/day: 1.00    Years: 30.00    Pack years: 30.00    Types: Cigarettes    Last attempt to quit: 11/29/2002    Years since quitting: 15.3  . Smokeless tobacco: Never Used  Substance Use Topics  . Alcohol use: Yes    Alcohol/week: 0.6 oz    Types: 1 Standard drinks or equivalent per week    Comment: rarely  . Drug use: No    Review of Systems Constitutional: No fever/chills Eyes: No visual changes. ENT: No sore throat. Cardiovascular: Denies chest pain. Respiratory: Denies shortness of breath. Gastrointestinal: Vomiting and diarrhea as described above Genitourinary: Negative for dysuria. Musculoskeletal: Negative for neck pain.  Negative for back pain. Integumentary: Negative for rash. Neurological: Negative for headaches, focal weakness or numbness.   ____________________________________________   PHYSICAL EXAM:  VITAL SIGNS: ED Triage Vitals  Enc Vitals Group     BP 03/20/18 2054 (!) 146/80     Pulse Rate 03/20/18 2054 (!) 107     Resp 03/20/18 2054 18     Temp 03/20/18 2054 98.8 F (37.1 C)     Temp Source 03/20/18 2054 Oral     SpO2 03/20/18 2054 94 %     Weight 03/20/18 2056 120.2 kg (265 lb)     Height 03/20/18 2056 1.854 m (6\' 1" )     Head Circumference --      Peak Flow --      Pain Score 03/20/18 2104 0     Pain Loc --      Pain Edu? --      Excl. in [place]? --     Constitutional: Alert and oriented. Well appearing and in no acute distress. Eyes: Conjunctivae are normal.  Head: Atraumatic. Nose: No congestion/rhinnorhea. Mouth/Throat: Mucous membranes are moist. Neck: No stridor.  No meningeal signs.   Cardiovascular: Borderline tachycardia upon triage, now improved,  regular rhythm. Good peripheral circulation. Grossly normal heart sounds. Respiratory: Normal respiratory effort.  No retractions. Lungs CTAB. Gastrointestinal: Soft and nontender. No distention.  Musculoskeletal: No lower extremity tenderness nor edema. No gross deformities of extremities. Neurologic:  Normal speech and language. No gross focal neurologic deficits are appreciated.  Skin:  Skin is warm, dry and intact. No rash noted. Psychiatric: Mood and affect are normal. Speech and behavior are normal.  ____________________________________________   LABS (all labs ordered are listed,  but only abnormal results are displayed)  Labs Reviewed  CBC - Abnormal; Notable for the following components:      Result Value   MCHC 36.2 (*)    All other components within normal limits  COMPREHENSIVE METABOLIC PANEL - Abnormal; Notable for the following components:   Glucose, Bld 131 (*)    BUN 21 (*)    Total Bilirubin 1.4 (*)    All other components within normal limits  LIPASE, BLOOD  URINALYSIS, COMPLETE (UACMP) WITH MICROSCOPIC   ____________________________________________  EKG  None - EKG not ordered by ED physician ____________________________________________  RADIOLOGY   ED MD interpretation: No indication for imaging  Official radiology report(s): No results found.  ____________________________________________   PROCEDURES  Critical Care performed: No   Procedure(s) performed:   Procedures   ____________________________________________   INITIAL IMPRESSION / ASSESSMENT AND PLAN / ED COURSE  As part of my medical decision making, I reviewed the following data within the [place] notes reviewed and incorporated and Labs reviewed     Differential diagnosis includes, but is not limited to, viral gastroenteritis, bacterial gastroenteritis, food poisoning, less likely ACS or biliary colic.  The patient has no tenderness to palpation.   His vital signs are within normal limits; he was initially slightly tachycardic but that resolved after rest and some fluids.  He is received 1 L normal saline, 4 mg of oral Zofran, and I am giving him an additional 4 mg of Zofran by IV because he says that his nausea is starting to return.  However he says that he feels well enough that he would like to go home and I think that is appropriate.  There is no indication for imaging.  In spite of all of the gastrointestinal losses, he does not appear clinically dehydrated, and his labs are all within normal limits.  There is no indication to check a urinalysis as he is having no symptoms.  His T bili is very slightly elevated at 1.4 which I think is reflective of his gastroenteritis rather than a different intra-abdominal process.  He understands and agrees with plan for follow-up.  I will write him a prescription for Zofran and I had my usual and customary recommendations for managing gastroenteritis as well.  Clinical Course as of Mar 22 255  Tue Mar 21, 2018  0256 Patient's fluids are done, no additional vomiting or diarrhea, comfortable with the plan for discharge and outpatient follow-up.   [CF]    Clinical Course User Index [CF] Hinda Kehr, MD    ____________________________________________  FINAL CLINICAL IMPRESSION(S) / ED DIAGNOSES  Final diagnoses:  Nausea vomiting and diarrhea     MEDICATIONS GIVEN DURING THIS VISIT:  Medications  ondansetron (ZOFRAN-ODT) disintegrating tablet 4 mg (4 mg Oral Given 03/20/18 2113)  sodium chloride 0.9 % bolus 1,000 mL (1,000 mLs Intravenous New Bag/Given 03/21/18 0115)  ondansetron (ZOFRAN) injection 4 mg (4 mg Intravenous Given 03/21/18 0145)     ED Discharge Orders        Ordered    ondansetron (ZOFRAN ODT) 4 MG disintegrating tablet     03/21/18 0147       Note:  This document was prepared using Dragon voice recognition software and may include unintentional dictation errors.      Hinda Kehr, MD 03/21/18 [phone]

## 2018-04-18 ENCOUNTER — Other Ambulatory Visit: Payer: Self-pay | Admitting: Family Medicine

## 2018-04-18 DIAGNOSIS — Z6835 Body mass index (BMI) 35.0-35.9, adult: Secondary | ICD-10-CM

## 2018-04-18 DIAGNOSIS — I10 Essential (primary) hypertension: Secondary | ICD-10-CM

## 2018-04-18 DIAGNOSIS — E7800 Pure hypercholesterolemia, unspecified: Secondary | ICD-10-CM

## 2018-12-07 ENCOUNTER — Other Ambulatory Visit: Payer: Self-pay | Admitting: Family Medicine

## 2018-12-07 DIAGNOSIS — I10 Essential (primary) hypertension: Secondary | ICD-10-CM

## 2018-12-07 DIAGNOSIS — E7800 Pure hypercholesterolemia, unspecified: Secondary | ICD-10-CM

## 2018-12-07 DIAGNOSIS — Z6835 Body mass index (BMI) 35.0-35.9, adult: Secondary | ICD-10-CM

## 2019-01-08 ENCOUNTER — Telehealth: Payer: Self-pay | Admitting: Family Medicine

## 2019-01-08 NOTE — Telephone Encounter (Signed)
error 

## 2019-05-21 ENCOUNTER — Encounter: Payer: Self-pay | Admitting: Gastroenterology

## 2019-05-25 ENCOUNTER — Encounter: Payer: Self-pay | Admitting: Gastroenterology

## 2021-06-04 ENCOUNTER — Encounter: Payer: Self-pay | Admitting: Gastroenterology
